# Patient Record
Sex: Female | Born: 1990 | Race: White | Hispanic: No | Marital: Single | Smoking: Never smoker
Health system: Southern US, Community
[De-identification: ages and names within clinical notes are randomized; demographics above are authoritative.]

## PROBLEM LIST (undated history)

## (undated) ENCOUNTER — Inpatient Hospital Stay: Payer: Self-pay

## (undated) DIAGNOSIS — Z789 Other specified health status: Secondary | ICD-10-CM

## (undated) HISTORY — PX: TONSILLECTOMY: SUR1361

## (undated) HISTORY — PX: WISDOM TOOTH EXTRACTION: SHX21

## (undated) HISTORY — PX: ANTERIOR CRUCIATE LIGAMENT REPAIR: SHX115

---
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
## 2012-08-23 ENCOUNTER — Emergency Department: Payer: Self-pay | Admitting: Emergency Medicine

## 2012-08-23 LAB — LIPASE, BLOOD: Lipase: 109 U/L (ref 73–393)

## 2012-08-23 LAB — COMPREHENSIVE METABOLIC PANEL
BUN: 14 mg/dL (ref 7–18)
Bilirubin,Total: 0.3 mg/dL (ref 0.2–1.0)
Calcium, Total: 8.7 mg/dL — ABNORMAL LOW (ref 9.0–10.7)
Chloride: 111 mmol/L — ABNORMAL HIGH (ref 98–107)
Co2: 23 mmol/L (ref 21–32)
Creatinine: 0.79 mg/dL (ref 0.60–1.30)
Glucose: 85 mg/dL (ref 65–99)
Potassium: 3.9 mmol/L (ref 3.5–5.1)
SGPT (ALT): 28 U/L (ref 12–78)

## 2012-08-23 LAB — CBC
HGB: 14.5 g/dL (ref 12.0–16.0)
MCH: 29.8 pg (ref 26.0–34.0)
Platelet: 301 10*3/uL (ref 150–440)
RBC: 4.86 10*6/uL (ref 3.80–5.20)
RDW: 13.2 % (ref 11.5–14.5)
WBC: 9.4 10*3/uL (ref 3.6–11.0)

## 2012-08-23 LAB — URINALYSIS, COMPLETE
Bacteria: NONE SEEN
Glucose,UR: NEGATIVE mg/dL (ref 0–75)
Leukocyte Esterase: NEGATIVE
Nitrite: NEGATIVE
RBC,UR: 2 /HPF (ref 0–5)
Squamous Epithelial: 5
WBC UR: 2 /HPF (ref 0–5)

## 2013-03-06 IMAGING — US ABDOMEN ULTRASOUND
1 series · 14 of 25 positions shown · non-contrast
Comparison: none

REASON FOR EXAM: EPIGASTRIC, BILATERAL SIDE PAIN
COMMENTS:   May transport without cardiac monitor

PROCEDURE:     US  - US ABDOMEN GENERAL SURVEY  - August 24, 2012  [DATE]
RESULT:

[Series 1: abdomen ultrasound · 0.23mm/px · 14 of 55 slices shown]
[im 1/55]
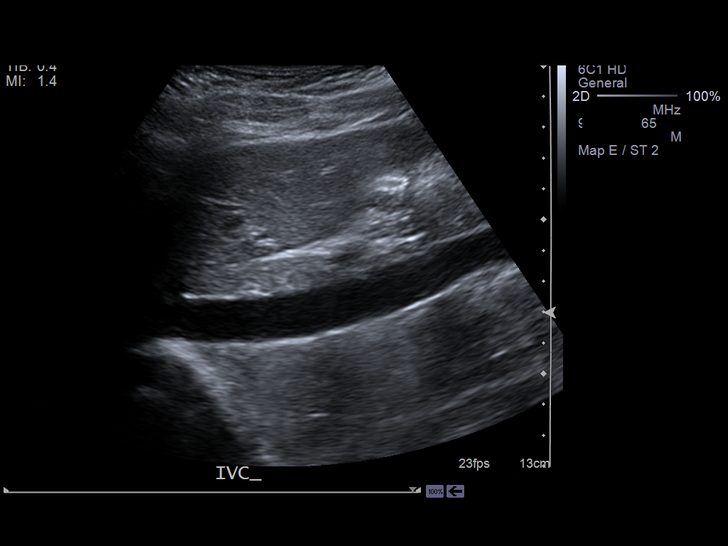
[im 5/55]
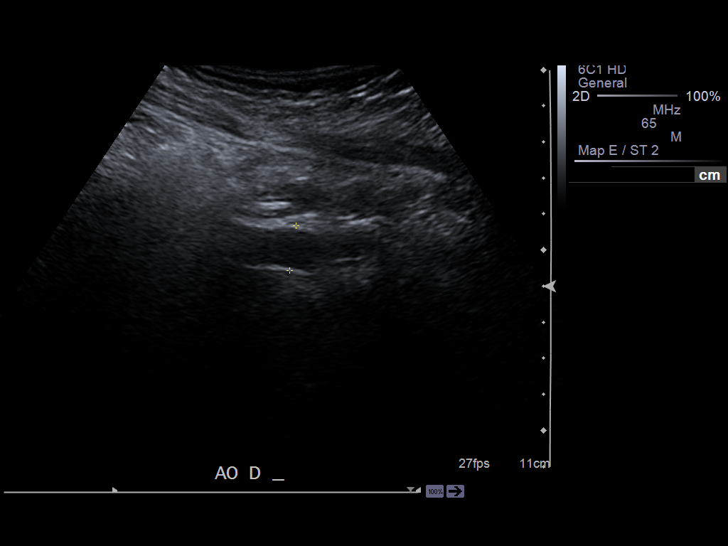
[im 10/55]
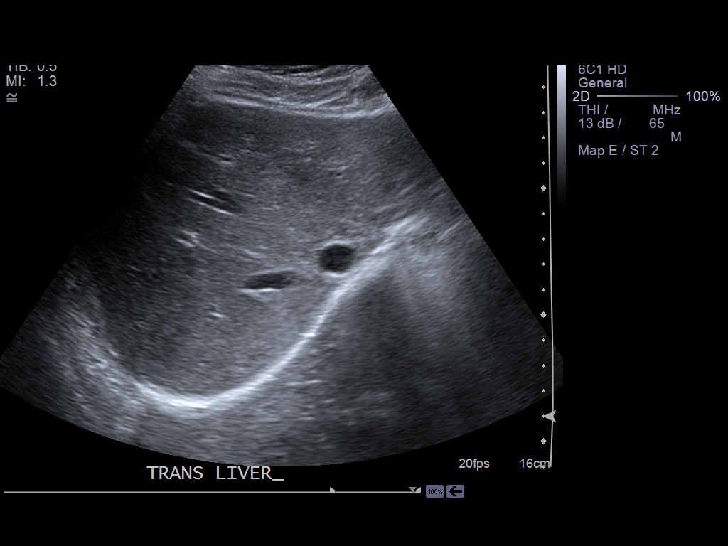
[im 14/55]
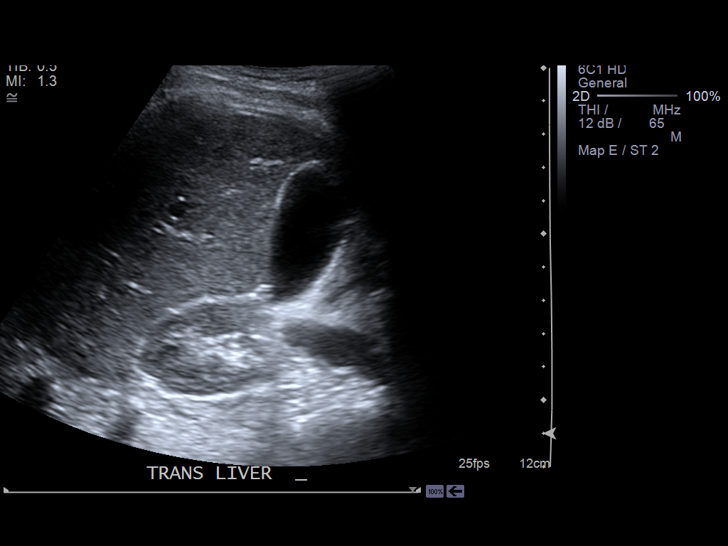
[im 19/55]
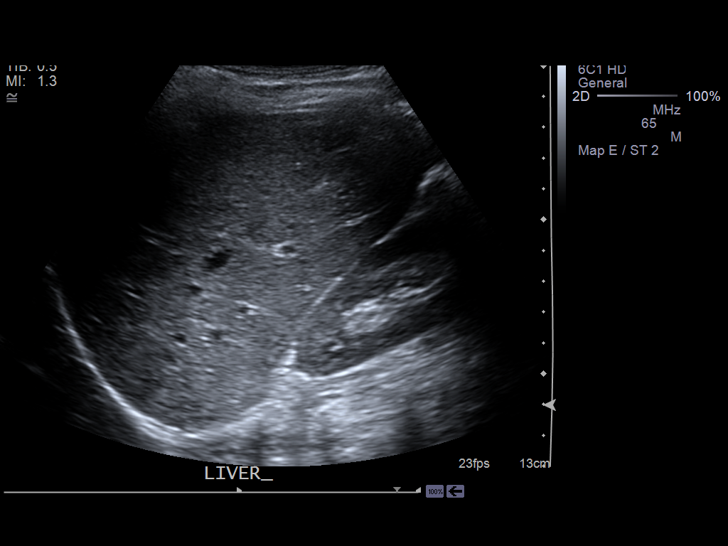
[im 21/55]
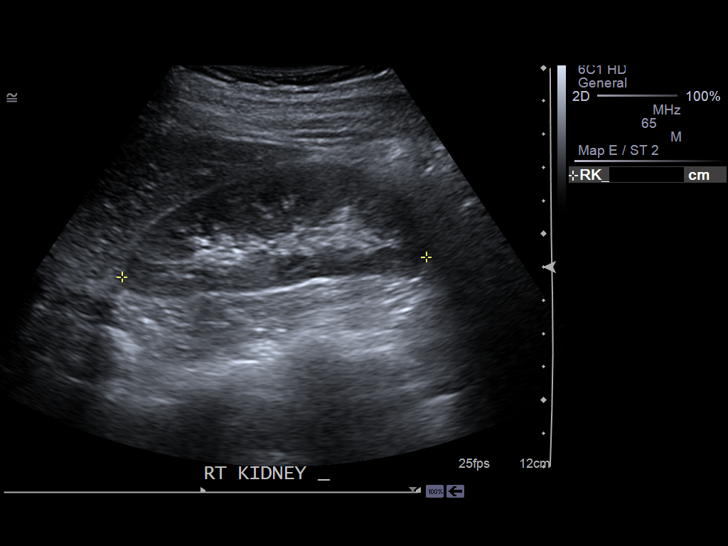
[im 25/55]
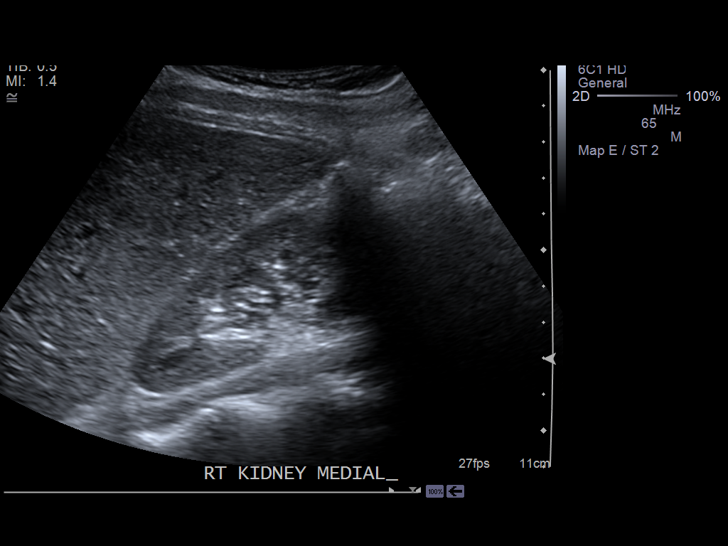
[im 30/55]
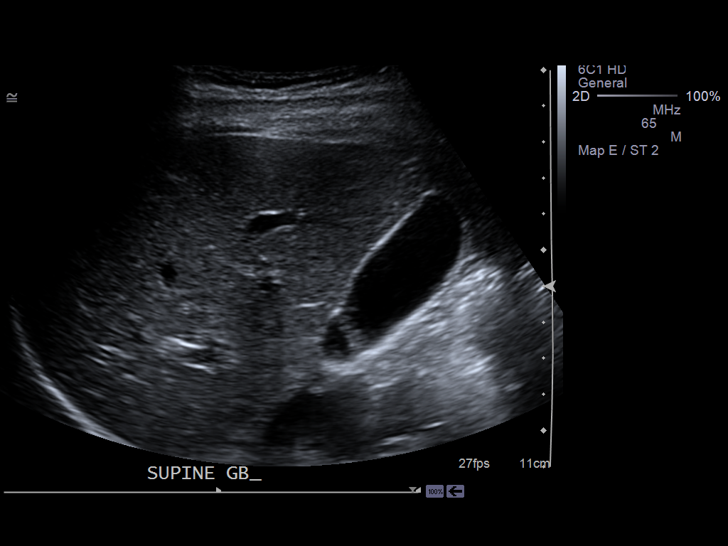
[im 34/55]
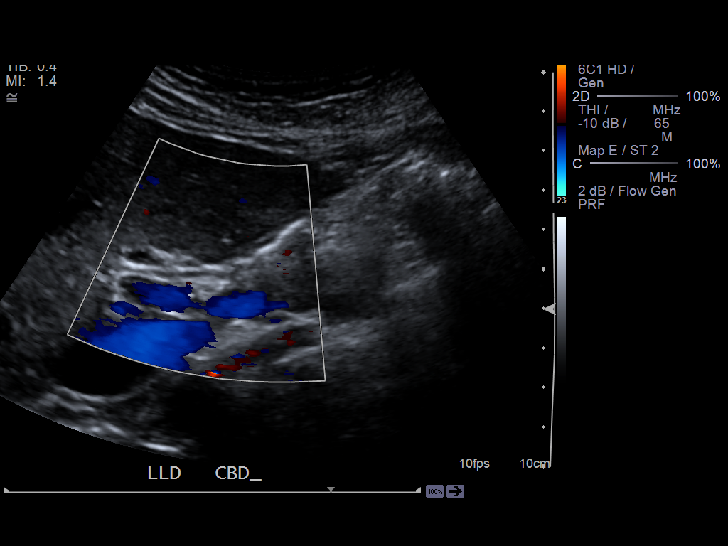
[im 37/55]
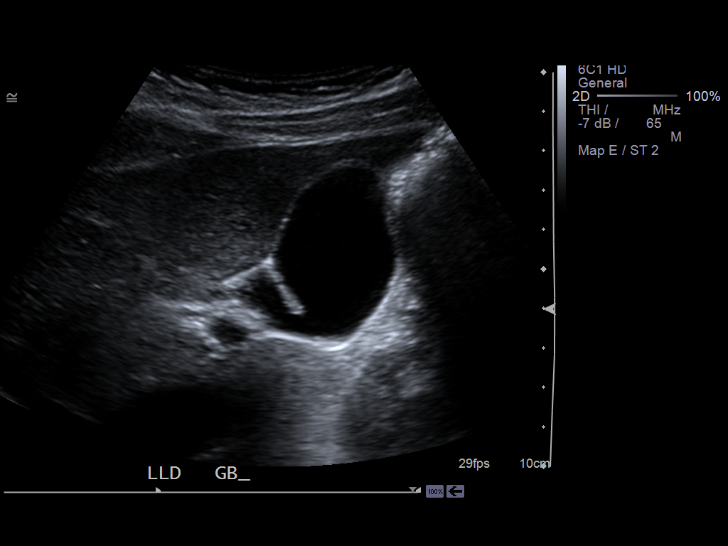
[im 41/55]
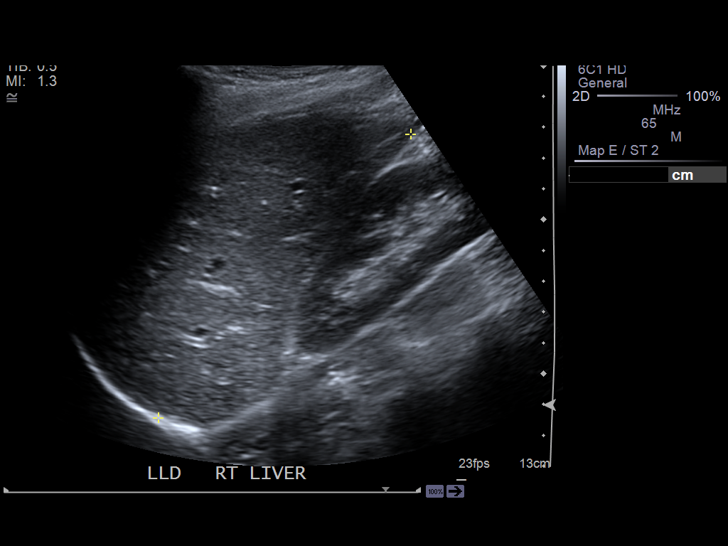
[im 46/55]
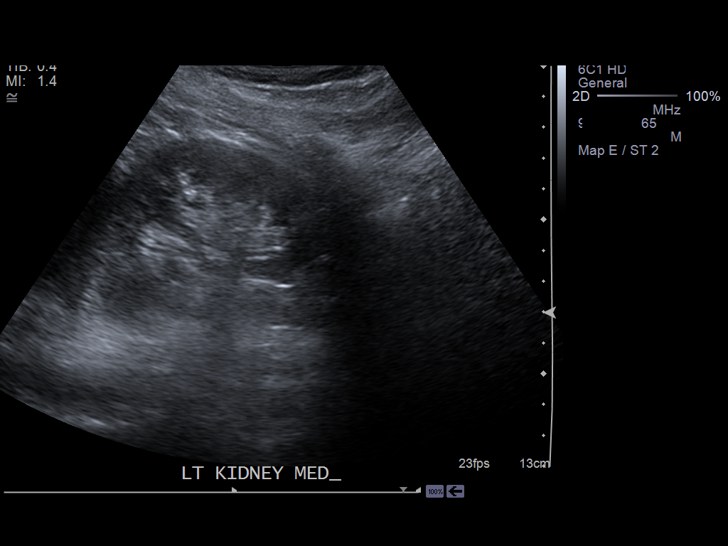
[im 50/55]
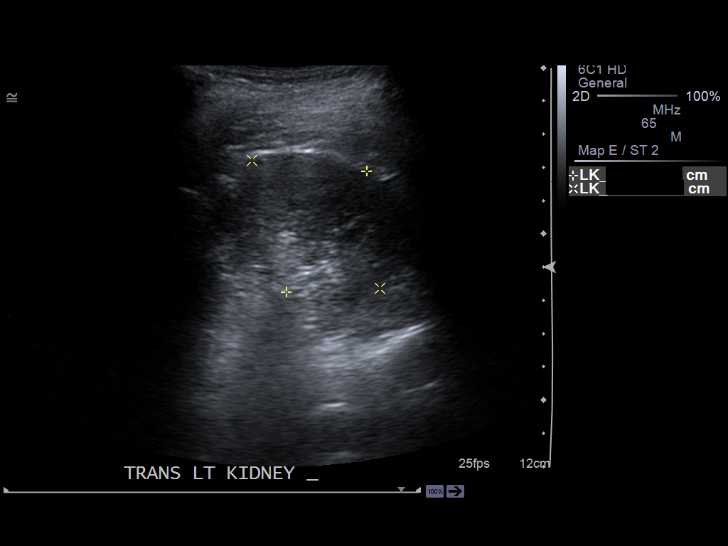
[im 55/55]
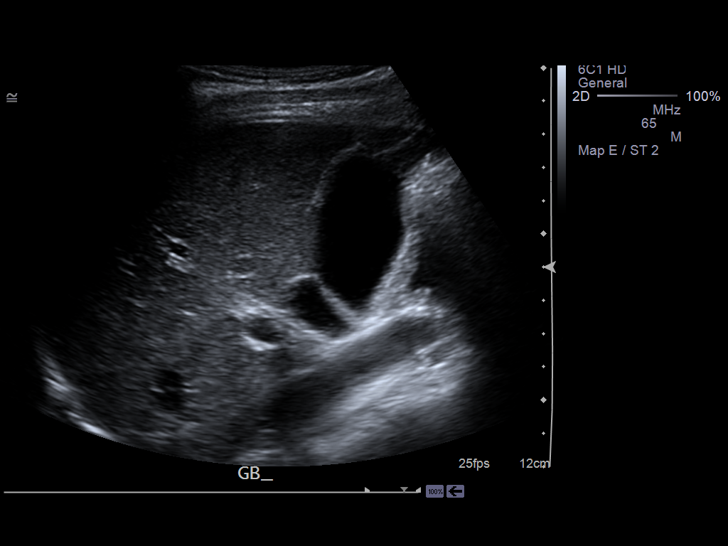

[14 of 25 positions shown; findings below may reference images not displayed]

FINDINGS: Abdominal Ultrasound demonstrates a normal echotexture of the
liver. There is no intrahepatic or extrahepatic biliary ductal dilation. The
common bile duct measures 3.2 mm. The liver length is 12.3 cm. The pancreas
appears normal. The proximal, mid and distal abdominal aorta appears
unremarkable. The inferior vena cava is patent proximally. The spleen
measures 7.7 cm and is normal in echotexture. The portal venous flow is
normal. The right kidney measures 9.17 x 3.71 x 3.44 cm. The left kidney is
9.54 x 4.37 x 5.44 cm. There are no solid or cystic masses. No urinary tract
obstructive changes are seen. There is no nephrolithiasis. No gallstones are
evident. Gallbladder wall thickness is 1.5 mm.
IMPRESSION: Normal appearing Abdominal Ultrasound.

[REDACTED]

## 2014-05-24 ENCOUNTER — Ambulatory Visit: Payer: Self-pay | Admitting: Neurology

## 2014-06-21 DIAGNOSIS — G43719 Chronic migraine without aura, intractable, without status migrainosus: Secondary | ICD-10-CM | POA: Insufficient documentation

## 2015-05-19 DIAGNOSIS — F988 Other specified behavioral and emotional disorders with onset usually occurring in childhood and adolescence: Secondary | ICD-10-CM | POA: Insufficient documentation

## 2020-07-30 NOTE — L&D Delivery Note (Signed)
Delivery Note  Toni Foster is a G1P1001 at [redacted]w[redacted]d with an unsure LMP, inconsistent with [place] at [redacted]w[redacted]d.   First Stage: Labor onset: 2024 with SROM Induction: misoprostol, oxytocin  Analgesia /Anesthesia intrapartum: epidural SROM at 2024 on 10/23/2020  Second Stage: Complete dilation at 1307 Onset of pushing at 1307 FHR second stage 125bpm with moderate variability, recurrent lates and variables noted during pushing.  Good return to baseline in between contractions.  Toni Foster presented to L&D for a scheduled IOL for postdates pregnancy.  She was actively managed with misoprostol and oxytocin.  She progressed well after SROM and was comfortable with an epidural.  She had a protracted active phase with cervical edema.  Oxytocin was turned off d/t NRFHR that resolved with conservative measures.  IV benadryl was given and frequent positioning utilized to help with rotation from OP to ROA.  She had a strong urge to push but still had a thin anterior lip present.  She was able to push to C/C/+1 with approximately 3 contractions.  She continued to push effectively for approximately 2 hours.   Delivery of a viable baby boy on 10/24/2020 at 1450 by CNM Delivery of fetal head in OA position with restitution to ROP. No nuchal cord;  Shoulders found to be in transverse with fetal head in ROP position.  Shoulders were manually rotated to oblique positions for delivery.  Anterior then posterior shoulders delivered easily after rotation with gentle downward traction. Baby placed on mom's chest, and attended to by peds. Cord double clamped after cessation of pulsation, cut by father of baby.  Cord blood sample collected: N/A, A pos   Third Stage: Oxytocin bolus started after delivery of infant for hemorrhage prophylaxis  Placenta delivered intact with 3 VC @ 1457 Placenta disposition: discarded  Uterine tone firm / bleeding small  1st degree hymenal laceration identified - hemostatic, approximates  well Anesthesia for repair: N/A Repair: N/A Est. Blood Loss (mL):  Complications: Protracted active phase   Mom to postpartum.  Baby to Couplet care / Skin to Skin.  Newborn: Information for the patient's newborn:  Toni, Foster [[id number]]  Live born female "Ace" Birth Weight: 7 lb 15 oz (3600 g) APGAR: 8, 9  Newborn Delivery   Birth date/time: 10/24/2020 14:50:00 Delivery type: Vaginal, Spontaneous    Feeding planned: Breast   ---------- Margaretmary Eddy, CNM Certified Nurse Midwife [facility]

## 2020-09-23 LAB — OB RESULTS CONSOLE GBS: GBS: NEGATIVE

## 2020-09-23 LAB — OB RESULTS CONSOLE GC/CHLAMYDIA
Chlamydia: NEGATIVE
Gonorrhea: NEGATIVE

## 2020-09-23 LAB — OB RESULTS CONSOLE HEPATITIS B SURFACE ANTIGEN: Hepatitis B Surface Ag: NEGATIVE

## 2020-09-23 LAB — OB RESULTS CONSOLE RPR: RPR: NONREACTIVE

## 2020-09-23 LAB — OB RESULTS CONSOLE RUBELLA ANTIBODY, IGM: Rubella: IMMUNE

## 2020-09-23 LAB — OB RESULTS CONSOLE HIV ANTIBODY (ROUTINE TESTING): HIV: NONREACTIVE

## 2020-09-23 LAB — OB RESULTS CONSOLE VARICELLA ZOSTER ANTIBODY, IGG: Varicella: IMMUNE

## 2020-10-14 ENCOUNTER — Other Ambulatory Visit: Payer: Self-pay | Admitting: Obstetrics and Gynecology

## 2020-10-14 NOTE — Progress Notes (Signed)
Dating: EDD: 10/18/20  by LMP: unknown, Nexplanon removal date 01/12/20, and c/w [place] at 9+0 wks.   Preg c/b: 1. Chronic migraines on prophy, Propanolol per [facility].  2. 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  3. Initial screening TSH low, repeat WNL  Prenatal Labs: Blood type/Rh A pos  Antibody screen neg  Rubella Immune  Varicella Immune  RPR NR  HBsAg Neg  HIV NR  GC neg  Chlamydia neg  Genetic screening negative  1 hour GTT 127  3 hour GTT   GBS Negative    Contraception: Nexplanon Infant feeding: plans to pump and feed EBM Tdap:08/18/20 Flu: 08/01/20

## 2020-10-19 ENCOUNTER — Encounter: Payer: Self-pay | Admitting: Obstetrics and Gynecology

## 2020-10-19 ENCOUNTER — Observation Stay
Admission: EM | Admit: 2020-10-19 | Discharge: 2020-10-20 | Disposition: A | Discharge status: Home / Self Care | Payer: BC Managed Care – PPO | Source: Home / Self Care | Admitting: Obstetrics and Gynecology

## 2020-10-19 ENCOUNTER — Other Ambulatory Visit: Payer: Self-pay

## 2020-10-19 DIAGNOSIS — O471 False labor at or after 37 completed weeks of gestation: Secondary | ICD-10-CM | POA: Insufficient documentation

## 2020-10-19 DIAGNOSIS — Z3A40 40 weeks gestation of pregnancy: Secondary | ICD-10-CM | POA: Insufficient documentation

## 2020-10-19 DIAGNOSIS — O479 False labor, unspecified: Secondary | ICD-10-CM | POA: Diagnosis present

## 2020-10-19 HISTORY — DX: Other specified health status: Z78.9

## 2020-10-19 MED ORDER — ACETAMINOPHEN 500 MG PO TABS
1000.0000 mg | ORAL_TABLET | ORAL | Status: DC | PRN
  Administered 2020-10-19: 1000 mg via ORAL

## 2020-10-19 MED ORDER — ACETAMINOPHEN 500 MG PO TABS
ORAL_TABLET | ORAL | Status: AC
  Filled 2020-10-19: qty 2

## 2020-10-19 NOTE — OB Triage Note (Signed)
Pt is a 29y/o G1P0 at wd with c/o contractions every 5 minutes that began about 530pm rating them not 8/10. Pt states +FM. Pt denies LOF, and VB. Monitors applied and assessing. Initial FHT 125.

## 2020-10-20 LAB — WET PREP, GENITAL
Clue Cells Wet Prep HPF POC: NONE SEEN
Sperm: NONE SEEN
Trich, Wet Prep: NONE SEEN
Yeast Wet Prep HPF POC: NONE SEEN

## 2020-10-20 MED ORDER — MORPHINE SULFATE (PF) 10 MG/ML IV SOLN
10.0000 mg | Freq: Once | INTRAVENOUS | Status: AC
  Administered 2020-10-20: 10 mg via INTRAMUSCULAR
  Filled 2020-10-20: qty 1

## 2020-10-20 MED ORDER — PROMETHAZINE HCL 25 MG/ML IJ SOLN
25.0000 mg | Freq: Once | INTRAMUSCULAR | Status: AC
  Administered 2020-10-20: 25 mg via INTRAMUSCULAR
  Filled 2020-10-20: qty 1

## 2020-10-20 NOTE — Discharge Summary (Signed)
Patient ID: Toni Foster MRN: [id number] DOB/AGE: 1991/07/02 30 y.o.  Admit date: 10/19/2020 Discharge date: 10/20/2020  Admission Diagnoses: UCs at 40 week  Discharge Diagnoses: Not in labor  Prenatal Procedures: NST  Consults: None  Significant Diagnostic Studies:  Results for orders placed or performed during the hospital encounter of 10/19/20 (from the past 168 hour(s))  Wet prep, genital   Collection Time: 10/20/20 12:28 AM   Specimen: Vaginal  Result Value Ref Range   Yeast Wet Prep HPF POC NONE SEEN NONE SEEN   Trich, Wet Prep NONE SEEN NONE SEEN   Clue Cells Wet Prep HPF POC NONE SEEN NONE SEEN   WBC, Wet Prep HPF POC RARE (A) NONE SEEN   Sperm NONE SEEN     Treatments: Morphine Rest  Hospital Course:  This is a 30 y.o. G1P0 with IUP at [redacted]w[redacted]d seen for UCs, noted to have a cervical exam of 1/50/-3 posterior.  No leaking of fluid and no bleeding. She was given morphine and phenergan for Morphine rest.   She was observed, fetal heart rate monitoring remained reassuring, and she had no signs/symptoms of progressing labor or other maternal-fetal concerns.  Her cervical exam was unchanged from admission.  She was deemed stable for discharge to home with outpatient follow up.  Discharge Physical Exam:  BP 106/65 (BP Location: Left Arm)   Pulse 81   Temp 98.3 F (36.8 C) (Oral)   Resp 17   Ht 5\' 2"  (1.575 m)   Wt 83 kg   BMI 33.47 kg/m   General: NAD CV: RRR Pulm: CTABL, nl effort ABD: s/nd/nt, gravid DVT Evaluation: LE non-ttp, no evidence of DVT on exam.  NST: FHR baseline: 125 bpm Variability: moderate Accelerations: yes Decelerations: none Category/reactivity: reactive   TOCO: occasional  SVE:  Dilation: 1 Effacement (%): 50 Cervical Position: Posterior Station: -3 Exam by:: [email or web address] CNM   Discharge Condition: Stable  Disposition: Discharge disposition: 01-Home or Self Care        Allergies as of 10/20/2020   No Known Allergies      Medication List    STOP taking these medications   amoxicillin-clavulanate 875-125 MG tablet Commonly known as: AUGMENTIN   cetirizine 10 MG tablet Commonly known as: ZYRTEC   etonogestrel-ethinyl estradiol 0.12-0.015 MG/24HR vaginal ring Commonly known as: NUVARING     TAKE these medications   diphenhydrAMINE 12.5 MG chewable tablet Commonly known as: BENADRYL Chew 12.5 mg by mouth 4 (four) times daily as needed for allergies.   loratadine 10 MG tablet Commonly known as: CLARITIN Take 10 mg by mouth daily.   multivitamin-prenatal 27-0.8 MG Tabs tablet Take 1 tablet by mouth daily at 12 noon.   propranolol 60 MG tablet Commonly known as: INDERAL Take 60 mg by mouth 3 (three) times daily.        [id number], CNM 10/20/2020 8:08 AM

## 2020-10-20 NOTE — Progress Notes (Signed)
Pt given discharge instructions. Pt set up for IOL on Sunday. Pt will go get covid test tomorrow. Reviewed labor precautions. Pt discharged with significant other.

## 2020-10-21 ENCOUNTER — Other Ambulatory Visit: Payer: Self-pay

## 2020-10-21 ENCOUNTER — Other Ambulatory Visit
Admission: RE | Admit: 2020-10-21 | Discharge: 2020-10-21 | Disposition: A | Discharge status: Home / Self Care | Payer: BC Managed Care – PPO | Source: Ambulatory Visit | Attending: Obstetrics and Gynecology | Admitting: Obstetrics and Gynecology

## 2020-10-21 DIAGNOSIS — Z20822 Contact with and (suspected) exposure to covid-19: Secondary | ICD-10-CM | POA: Insufficient documentation

## 2020-10-21 DIAGNOSIS — Z01812 Encounter for preprocedural laboratory examination: Secondary | ICD-10-CM | POA: Insufficient documentation

## 2020-10-22 LAB — SARS CORONAVIRUS 2 (TAT 6-24 HRS): SARS Coronavirus 2: NEGATIVE

## 2020-10-23 ENCOUNTER — Encounter: Payer: Self-pay | Admitting: Obstetrics and Gynecology

## 2020-10-23 ENCOUNTER — Inpatient Hospital Stay: Payer: BC Managed Care – PPO | Admitting: Anesthesiology

## 2020-10-23 ENCOUNTER — Inpatient Hospital Stay
Admission: EM | Admit: 2020-10-23 | Discharge: 2020-10-25 | DRG: 806 | Disposition: A | Discharge status: Home / Self Care | Payer: BC Managed Care – PPO | Attending: Obstetrics and Gynecology | Admitting: Obstetrics and Gynecology

## 2020-10-23 ENCOUNTER — Other Ambulatory Visit: Payer: Self-pay

## 2020-10-23 DIAGNOSIS — O99354 Diseases of the nervous system complicating childbirth: Secondary | ICD-10-CM | POA: Diagnosis present

## 2020-10-23 DIAGNOSIS — Z20822 Contact with and (suspected) exposure to covid-19: Secondary | ICD-10-CM | POA: Diagnosis present

## 2020-10-23 DIAGNOSIS — O9081 Anemia of the puerperium: Secondary | ICD-10-CM | POA: Diagnosis not present

## 2020-10-23 DIAGNOSIS — Z3A40 40 weeks gestation of pregnancy: Secondary | ICD-10-CM

## 2020-10-23 DIAGNOSIS — D62 Acute posthemorrhagic anemia: Secondary | ICD-10-CM | POA: Diagnosis not present

## 2020-10-23 DIAGNOSIS — O480 Post-term pregnancy: Principal | ICD-10-CM | POA: Diagnosis present

## 2020-10-23 DIAGNOSIS — G43709 Chronic migraine without aura, not intractable, without status migrainosus: Secondary | ICD-10-CM | POA: Diagnosis present

## 2020-10-23 DIAGNOSIS — O26893 Other specified pregnancy related conditions, third trimester: Secondary | ICD-10-CM | POA: Diagnosis present

## 2020-10-23 LAB — COMPREHENSIVE METABOLIC PANEL
ALT: 12 U/L (ref 0–44)
AST: 23 U/L (ref 15–41)
Albumin: 2.9 g/dL — ABNORMAL LOW (ref 3.5–5.0)
Alkaline Phosphatase: 86 U/L (ref 38–126)
Anion gap: 11 (ref 5–15)
BUN: 6 mg/dL (ref 6–20)
CO2: 18 mmol/L — ABNORMAL LOW (ref 22–32)
Calcium: 9.2 mg/dL (ref 8.9–10.3)
Chloride: 107 mmol/L (ref 98–111)
Creatinine, Ser: 0.55 mg/dL (ref 0.44–1.00)
GFR, Estimated: >60 mL/min (ref >60)
Glucose, Bld: 106 mg/dL — ABNORMAL HIGH (ref 70–99)
Potassium: 3.4 mmol/L — ABNORMAL LOW (ref 3.5–5.1)
Sodium: 136 mmol/L (ref 135–145)
Total Bilirubin: 0.5 mg/dL (ref 0.3–1.2)
Total Protein: 6 g/dL — ABNORMAL LOW (ref 6.5–8.1)

## 2020-10-23 LAB — CBC
HCT: 34.6 % — ABNORMAL LOW (ref 36.0–46.0)
Hemoglobin: 11.6 g/dL — ABNORMAL LOW (ref 12.0–15.0)
MCH: 32 pg (ref 26.0–34.0)
MCHC: 33.5 g/dL (ref 30.0–36.0)
MCV: 95.6 fL (ref 80.0–100.0)
Platelets: 226 10*3/uL (ref 150–400)
RBC: 3.62 MIL/uL — ABNORMAL LOW (ref 3.87–5.11)
RDW: 14 % (ref 11.5–15.5)
WBC: 11.6 10*3/uL — ABNORMAL HIGH (ref 4.0–10.5)
nRBC: 0 % (ref 0.0–0.2)

## 2020-10-23 LAB — PROTEIN / CREATININE RATIO, URINE
Creatinine, Urine: 53 mg/dL
Total Protein, Urine: <6 mg/dL

## 2020-10-23 LAB — TYPE AND SCREEN
ABO/RH(D): A POS
Antibody Screen: NEGATIVE

## 2020-10-23 LAB — ABO/RH: ABO/RH(D): A POS

## 2020-10-23 LAB — RPR: RPR Ser Ql: NONREACTIVE

## 2020-10-23 MED ORDER — LACTATED RINGERS IV SOLN: INTRAVENOUS | Status: DC

## 2020-10-23 MED ORDER — SODIUM CHLORIDE 0.9 % IV SOLN
INTRAVENOUS | Status: DC | PRN
  Administered 2020-10-23 (×2): 5 mL via EPIDURAL

## 2020-10-23 MED ORDER — OXYTOCIN BOLUS FROM INFUSION
333.0000 mL | Freq: Once | INTRAVENOUS | Status: AC
  Administered 2020-10-24: 333 mL via INTRAVENOUS

## 2020-10-23 MED ORDER — LIDOCAINE HCL (PF) 1 % IJ SOLN
INTRAMUSCULAR | Status: DC | PRN
  Administered 2020-10-23: 1 mL via SUBCUTANEOUS

## 2020-10-23 MED ORDER — AMMONIA AROMATIC IN INHA
RESPIRATORY_TRACT | Status: AC
  Filled 2020-10-23: qty 10

## 2020-10-23 MED ORDER — LACTATED RINGERS IV SOLN
500.0000 mL | Freq: Once | INTRAVENOUS | Status: AC
  Administered 2020-10-23: 500 mL via INTRAVENOUS

## 2020-10-23 MED ORDER — OXYTOCIN 10 UNIT/ML IJ SOLN
INTRAMUSCULAR | Status: AC
  Filled 2020-10-23: qty 2

## 2020-10-23 MED ORDER — TERBUTALINE SULFATE 1 MG/ML IJ SOLN
0.2500 mg | Freq: Once | INTRAMUSCULAR | Status: DC | PRN
Start: 2020-10-23 — End: 2020-10-24

## 2020-10-23 MED ORDER — LIDOCAINE-EPINEPHRINE (PF) 1.5 %-1:200000 IJ SOLN
INTRAMUSCULAR | Status: DC | PRN
  Administered 2020-10-23: 3 mL via EPIDURAL

## 2020-10-23 MED ORDER — ONDANSETRON HCL 4 MG/2ML IJ SOLN
4.0000 mg | Freq: Four times a day (QID) | INTRAMUSCULAR | Status: DC | PRN
  Administered 2020-10-23: 4 mg via INTRAVENOUS
  Filled 2020-10-23: qty 2

## 2020-10-23 MED ORDER — FENTANYL 2.5 MCG/ML W/ROPIVACAINE 0.15% IN NS 100 ML EPIDURAL (ARMC)
EPIDURAL | Status: AC
  Administered 2020-10-24: 12 mL/h via EPIDURAL
  Filled 2020-10-23: qty 100

## 2020-10-23 MED ORDER — MISOPROSTOL 25 MCG QUARTER TABLET
25.0000 ug | ORAL_TABLET | ORAL | Status: DC | PRN
Start: 2020-10-23 — End: 2020-10-24
  Administered 2020-10-23 (×2): 25 ug via BUCCAL
  Filled 2020-10-23 (×2): qty 1

## 2020-10-23 MED ORDER — EPHEDRINE 5 MG/ML INJ: 10.0000 mg | INTRAVENOUS | Status: DC | PRN

## 2020-10-23 MED ORDER — PHENYLEPHRINE 40 MCG/ML (10ML) SYRINGE FOR IV PUSH (FOR BLOOD PRESSURE SUPPORT): 80.0000 ug | PREFILLED_SYRINGE | INTRAVENOUS | Status: DC | PRN

## 2020-10-23 MED ORDER — LIDOCAINE HCL (PF) 1 % IJ SOLN
INTRAMUSCULAR | Status: AC
  Filled 2020-10-23: qty 30

## 2020-10-23 MED ORDER — MISOPROSTOL 25 MCG QUARTER TABLET
25.0000 ug | ORAL_TABLET | ORAL | Status: DC | PRN
Start: 2020-10-23 — End: 2020-10-24
  Administered 2020-10-23 (×2): 25 ug via VAGINAL
  Filled 2020-10-23 (×2): qty 1

## 2020-10-23 MED ORDER — FENTANYL CITRATE (PF) 100 MCG/2ML IJ SOLN
50.0000 ug | INTRAMUSCULAR | Status: DC | PRN
  Administered 2020-10-23: 100 ug via INTRAVENOUS
  Filled 2020-10-23: qty 2

## 2020-10-23 MED ORDER — SOD CITRATE-CITRIC ACID 500-334 MG/5ML PO SOLN
30.0000 mL | ORAL | Status: DC | PRN
Start: 2020-10-23 — End: 2020-10-24

## 2020-10-23 MED ORDER — OXYTOCIN-SODIUM CHLORIDE 30-0.9 UT/500ML-% IV SOLN
2.5000 [IU]/h | INTRAVENOUS | Status: DC
Start: 2020-10-23 — End: 2020-10-25
  Administered 2020-10-24: 2.5 [IU]/h via INTRAVENOUS
  Filled 2020-10-23: qty 500

## 2020-10-23 MED ORDER — ACETAMINOPHEN 325 MG PO TABS: 650.0000 mg | ORAL_TABLET | ORAL | Status: DC | PRN

## 2020-10-23 MED ORDER — FENTANYL 2.5 MCG/ML W/ROPIVACAINE 0.15% IN NS 100 ML EPIDURAL (ARMC)
12.0000 mL/h | EPIDURAL | Status: DC
  Administered 2020-10-23 – 2020-10-24 (×2): 12 mL/h via EPIDURAL
  Filled 2020-10-23 (×2): qty 100

## 2020-10-23 MED ORDER — LIDOCAINE HCL (PF) 1 % IJ SOLN: 30.0000 mL | INTRAMUSCULAR | Status: DC | PRN

## 2020-10-23 MED ORDER — MISOPROSTOL 200 MCG PO TABS
ORAL_TABLET | ORAL | Status: AC
  Filled 2020-10-23: qty 4

## 2020-10-23 MED ORDER — OXYTOCIN-SODIUM CHLORIDE 30-0.9 UT/500ML-% IV SOLN
1.0000 m[IU]/min | INTRAVENOUS | Status: DC
  Administered 2020-10-23: 2 m[IU]/min via INTRAVENOUS
  Filled 2020-10-23: qty 500

## 2020-10-23 MED ORDER — LACTATED RINGERS IV SOLN
500.0000 mL | INTRAVENOUS | Status: DC | PRN
  Administered 2020-10-24: 500 mL via INTRAVENOUS

## 2020-10-23 MED ORDER — DIPHENHYDRAMINE HCL 50 MG/ML IJ SOLN: 12.5000 mg | INTRAMUSCULAR | Status: DC | PRN

## 2020-10-23 NOTE — H&P (Signed)
OB History & Physical   History of Present Illness:  Chief Complaint: scheduled induction for late term.   HPI:  FAVOR KREH is a 30 y.o. G1P0 female at [redacted]w[redacted]d dated by [place] at 9+0wks.  She presents to L&D for scheduled IOL.  Reports active FM, having mild UCs that are "annoying" but not painful. Denies LOF or VB.   Pregnancy Issues: 1. Chronic migraines on prophy, Propanolol per [facility].  2. 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  3. Initial screening TSH low, repeat WNL   Maternal Medical History:   Past Medical History:  Diagnosis Date  . Medical history non-contributory     Past Surgical History:  Procedure Laterality Date  . ANTERIOR CRUCIATE LIGAMENT REPAIR    . TONSILLECTOMY    . WISDOM TOOTH EXTRACTION      No Known Allergies  Prior to Admission medications   Medication Sig Start Date End Date Taking? Authorizing Provider  diphenhydrAMINE (BENADRYL) 12.5 MG chewable tablet Chew 12.5 mg by mouth 4 (four) times daily as needed for allergies.   Yes [provider]  loratadine (CLARITIN) 10 MG tablet Take 10 mg by mouth daily.   Yes [provider]  Prenatal Vit-Fe Fumarate-FA (MULTIVITAMIN-PRENATAL) 27-0.8 MG TABS tablet Take 1 tablet by mouth daily at 12 noon.   Yes [provider]  propranolol (INDERAL) 60 MG tablet Take 60 mg by mouth 3 (three) times daily.   Yes [provider]     Prenatal care site: [facility]  Social History: She  reports that she has never smoked. She has never used smokeless tobacco. She reports that she does not drink alcohol and does not use drugs.  Family History: no family hx Gyn cancers  Review of Systems: A full review of systems was performed and negative except as noted in the HPI.     Physical Exam:  Vital Signs: BP 118/83 (BP Location: Left Arm)   Pulse 71   Temp 98 F (36.7 C) (Oral)   Resp 16   Ht 5\' 2"  (1.575 m)   Wt 83 kg   BMI 33.47 kg/m  General: no  acute distress.  HEENT: normocephalic, atraumatic Heart: regular rate & rhythm.  No murmurs/rubs/gallops Lungs: clear to auscultation bilaterally, normal respiratory effort Abdomen: soft, gravid, non-tender;  EFW: 7.5lbs Pelvic:   External: Normal external female genitalia  Cervix: Dilation: 2 / Effacement (%): 70,80 / Station: -2    Extremities: non-tender, symmetric, no edema bilaterally.  DTRs: 2+  Neurologic: Alert & oriented x 3.    Results for orders placed or performed during the hospital encounter of 10/23/20 (from the past 24 hour(s))  CBC     Status: Abnormal   Collection Time: 10/23/20  5:57 AM  Result Value Ref Range   WBC 11.6 (H) 4.0 - 10.5 K/uL   RBC 3.62 (L) 3.87 - 5.11 MIL/uL   Hemoglobin 11.6 (L) 12.0 - 15.0 g/dL   HCT 10/25/20 (L) 27.2 - 53.6 %   MCV 95.6 80.0 - 100.0 fL   MCH 32.0 26.0 - 34.0 pg   MCHC 33.5 30.0 - 36.0 g/dL   RDW 64.4 03.4 - 74.2 %   Platelets 226 150 - 400 K/uL   nRBC 0.0 0.0 - 0.2 %  Type and screen     Status: None   Collection Time: 10/23/20  5:57 AM  Result Value Ref Range   ABO/RH(D) A POS    Antibody Screen NEG  Sample Expiration      10/26/2020,2359 Performed at [facility], [address] [place], [place] [address]   RPR     Status: None   Collection Time: 10/23/20  5:57 AM  Result Value Ref Range   RPR Ser Ql NON REACTIVE NON REACTIVE  Comprehensive metabolic panel     Status: Abnormal   Collection Time: 10/23/20  5:57 AM  Result Value Ref Range   Sodium 136 135 - 145 mmol/L   Potassium 3.4 (L) 3.5 - 5.1 mmol/L   Chloride 107 98 - 111 mmol/L   CO2 18 (L) 22 - 32 mmol/L   Glucose, Bld 106 (H) 70 - 99 mg/dL   BUN 6 6 - 20 mg/dL   Creatinine, Ser 5.10 0.44 - 1.00 mg/dL   Calcium 9.2 8.9 - 25.8 mg/dL   Total Protein 6.0 (L) 6.5 - 8.1 g/dL   Albumin 2.9 (L) 3.5 - 5.0 g/dL   AST 23 15 - 41 U/L   ALT 12 0 - 44 U/L   Alkaline Phosphatase 86 38 - 126 U/L   Total Bilirubin 0.5 0.3 - 1.2 mg/dL   GFR, Estimated >52  >77 mL/min   Anion gap 11 5 - 15  Protein / creatinine ratio, urine     Status: None   Collection Time: 10/23/20  6:08 AM  Result Value Ref Range   Creatinine, Urine 53 mg/dL   Total Protein, Urine <6 mg/dL   Protein Creatinine Ratio        0.00 - 0.15 mg/mg[Cre]  ABO/Rh     Status: None   Collection Time: 10/23/20  7:14 AM  Result Value Ref Range   ABO/RH(D)      A POS Performed at [facility], [address] [place], [place] [address]     Pertinent Results:  Prenatal Labs: Blood type/Rh A Pos  Antibody screen neg  Rubella Immune  Varicella Immune  RPR NR  HBsAg Neg  HIV NR  GC neg  Chlamydia neg  Genetic screening negative  1 hour GTT  127  3 hour GTT   GBS  Negative   FHT:  115bpm, mod variability, + accels, no decels TOCO: q2-36min SVE:  Dilation: 2 / Effacement (%): 70,80 / Station: -2    Cephalic by leopolds/SVE  No results found.  Assessment:  MAKEISHA JENTSCH is a 30 y.o. G1P0 female at [redacted]w[redacted]d with late term IOL.   Plan:  1. Admit to Labor & Delivery; consents reviewed and obtained - COVID swab negative  2. Fetal Well being  - Fetal Tracing: Cat I tracing - Group B Streptococcus ppx indicated: negative - Presentation: cephalic confirmed by exam   3. Routine OB: - Prenatal labs reviewed, as above - Rh A pos - CBC, T&S, RPR on admit - Clear fluids, IVF  4. Induction of Labor -  Contractions: external toco in place -  Pelvis adequate for TOL -  Plan for induction with cytotec -  Plan for continuous fetal monitoring  -  Maternal pain control as desired - Anticipate vaginal delivery  5. Post Partum Planning: Contraception: Nexplanon Infant feeding: plans to pump and feed EBM Tdap:08/18/20 Flu: 08/01/20  Prudencio Pair Nami Strawder, CNM 10/23/20 12:17 PM

## 2020-10-23 NOTE — Anesthesia Procedure Notes (Signed)
Epidural Patient location during procedure: OB Start time: 10/23/2020 8:41 PM End time: 10/23/2020 8:46 PM  Staffing Anesthesiologist: Zianna Dercole, Cleda Mccreedy, MD Performed: anesthesiologist   Preanesthetic Checklist Completed: patient identified, IV checked, site marked, risks and benefits discussed, surgical consent, monitors and equipment checked, pre-op evaluation and timeout performed  Epidural Patient position: sitting Prep: ChloraPrep Patient monitoring: heart rate, continuous pulse ox and blood pressure Approach: midline Location: L3-L4 Injection technique: LOR saline  Needle:  Needle type: Tuohy  Needle gauge: 17 G Needle length: 9 cm and 9 Needle insertion depth: 7 cm Catheter type: closed end flexible Catheter size: 19 Gauge Catheter at skin depth: 11 cm Test dose: negative and 1.5% lidocaine with Epi 1:200 K  Assessment Sensory level: T10 Events: blood not aspirated, injection not painful, no injection resistance, no paresthesia and negative IV test  Additional Notes 1 attempt.  Patient exquisitely tender during the procedure. Pt. Evaluated and documentation done after procedure finished. Patient identified. Risks/Benefits/Options discussed with patient including but not limited to bleeding, infection, nerve damage, paralysis, failed block, incomplete pain control, headache, blood pressure changes, nausea, vomiting, reactions to medication both or allergic, itching and postpartum back pain. Confirmed with bedside nurse the patient's most recent platelet count. Confirmed with patient that they are not currently taking any anticoagulation, have any bleeding history or any family history of bleeding disorders. Patient expressed understanding and wished to proceed. All questions were answered. Sterile technique was used throughout the entire procedure. Please see nursing notes for vital signs. Test dose was given through epidural catheter and negative prior to continuing to  dose epidural or start infusion. Warning signs of high block given to the patient including shortness of breath, tingling/numbness in hands, complete motor block, or any concerning symptoms with instructions to call for help. Patient was given instructions on fall risk and not to get out of bed. All questions and concerns addressed with instructions to call with any issues or inadequate analgesia.   Patient tolerated the insertion well without immediate complications.Reason for block:procedure for pain

## 2020-10-23 NOTE — Progress Notes (Signed)
Labor Progress Note  Toni Foster is a 30 y.o. G1P0 at [redacted]w[redacted]d by ultrasound admitted for induction of labor due to Post dates. Due date 10/18/20  Subjective: feeling more uncomfortable cramping.   Objective: BP 110/60 (BP Location: Left Arm)   Pulse 75   Temp 98.2 F (36.8 C) (Oral)   Resp 16   Ht 5\' 2"  (1.575 m)   Wt 83 kg   BMI 33.47 kg/m  Notable VS details: reviewed  Fetal Assessment: FHT:  FHR: 120 bpm, variability: moderate,  accelerations:  Present,  decelerations:  Absent Category/reactivity:  Category I UC:   regular, every 2-3 minutes SVE:   3/75/-2, soft/anterior. Membranes swept.   Membrane status: intact   Labs: Lab Results  Component Value Date   WBC 11.6 (H) 10/23/2020   HGB 11.6 (L) 10/23/2020   HCT 34.6 (L) 10/23/2020   MCV 95.6 10/23/2020   PLT 226 10/23/2020    Assessment / Plan: G1P0 at [redacted]w[redacted]d, IOL  Labor: Progressing normally, s/p 2 doses cytotec, plan to start Pitocin.  Preeclampsia:  no e/o Pre-E Fetal Wellbeing:  Category I Pain Control:  Labor support without medications I/D:  n/a Anticipated MOD:  NSVD  [redacted]w[redacted]d, CNM 10/23/2020, 4:47 PM

## 2020-10-23 NOTE — Anesthesia Preprocedure Evaluation (Signed)
Anesthesia Evaluation  Patient identified by MRN, date of birth, ID band Patient awake    Reviewed: Allergy & Precautions, H&P , NPO status , Patient's Chart, lab work & pertinent test results  History of Anesthesia Complications Negative for: history of anesthetic complications  Airway Mallampati: III  TM Distance: >3 FB Neck ROM: full    Dental  (+) Chipped   Pulmonary neg pulmonary ROS,    Pulmonary exam normal        Cardiovascular Exercise Tolerance: Good (-) hypertensionnegative cardio ROS Normal cardiovascular exam     Neuro/Psych    GI/Hepatic negative GI ROS,   Endo/Other    Renal/GU   negative genitourinary   Musculoskeletal   Abdominal   Peds  Hematology negative hematology ROS (+)   Anesthesia Other Findings Past Medical History: No date: Medical history non-contributory  Past Surgical History: No date: ANTERIOR CRUCIATE LIGAMENT REPAIR No date: TONSILLECTOMY No date: WISDOM TOOTH EXTRACTION  BMI    Body Mass Index: 33.47 kg/m      Reproductive/Obstetrics (+) Pregnancy                             Anesthesia Physical Anesthesia Plan  ASA: II  Anesthesia Plan: Epidural   Post-op Pain Management:    Induction:   PONV Risk Score and Plan:   Airway Management Planned: Natural Airway  Additional Equipment:   Intra-op Plan:   Post-operative Plan:   Informed Consent: I have reviewed the patients History and Physical, chart, labs and discussed the procedure including the risks, benefits and alternatives for the proposed anesthesia with the patient or authorized representative who has indicated his/her understanding and acceptance.     Dental Advisory Given  Plan Discussed with: Anesthesiologist  Anesthesia Plan Comments: (Patient reports no bleeding problems and no anticoagulant use.   Patient consented for risks of anesthesia including but not  limited to:  - adverse reactions to medications - risk of bleeding, infection and or nerve damage from epidural that could lead to paralysis - risk of headache or failed epidural - nerve damage due to positioning - that if epidural is used for C-section that there is a chance of epidural failure requiring spinal placement or conversion to GA - Damage to heart, brain, lungs, other parts of body or loss of life  Patient voiced understanding.)        Anesthesia Quick Evaluation

## 2020-10-24 ENCOUNTER — Encounter: Payer: Self-pay | Admitting: Obstetrics and Gynecology

## 2020-10-24 MED ORDER — WITCH HAZEL-GLYCERIN EX PADS
1.0000 "application " | MEDICATED_PAD | CUTANEOUS | Status: DC
  Filled 2020-10-24 (×2): qty 100

## 2020-10-24 MED ORDER — WITCH HAZEL-GLYCERIN EX PADS
MEDICATED_PAD | CUTANEOUS | Status: AC
  Filled 2020-10-24: qty 100

## 2020-10-24 MED ORDER — IBUPROFEN 600 MG PO TABS
600.0000 mg | ORAL_TABLET | Freq: Four times a day (QID) | ORAL | Status: DC
  Administered 2020-10-24 – 2020-10-25 (×5): 600 mg via ORAL
  Filled 2020-10-24 (×5): qty 1

## 2020-10-24 MED ORDER — DIBUCAINE (PERIANAL) 1 % EX OINT
1.0000 "application " | TOPICAL_OINTMENT | CUTANEOUS | Status: DC | PRN
  Administered 2020-10-24: 1 via RECTAL
  Filled 2020-10-24 (×2): qty 28

## 2020-10-24 MED ORDER — BENZOCAINE-MENTHOL 20-0.5 % EX AERO
1.0000 "application " | INHALATION_SPRAY | CUTANEOUS | Status: DC | PRN
  Filled 2020-10-24 (×2): qty 56

## 2020-10-24 MED ORDER — CALCIUM CARBONATE ANTACID 500 MG PO CHEW
CHEWABLE_TABLET | ORAL | Status: AC
  Filled 2020-10-24: qty 2

## 2020-10-24 MED ORDER — ONDANSETRON HCL 4 MG PO TABS
4.0000 mg | ORAL_TABLET | ORAL | Status: DC | PRN
  Filled 2020-10-24: qty 1

## 2020-10-24 MED ORDER — ACETAMINOPHEN 500 MG PO TABS
1000.0000 mg | ORAL_TABLET | Freq: Four times a day (QID) | ORAL | Status: DC | PRN
  Administered 2020-10-24 – 2020-10-25 (×5): 1000 mg via ORAL
  Filled 2020-10-24 (×5): qty 2

## 2020-10-24 MED ORDER — DIBUCAINE (PERIANAL) 1 % EX OINT
TOPICAL_OINTMENT | CUTANEOUS | Status: AC
  Filled 2020-10-24: qty 28

## 2020-10-24 MED ORDER — CALCIUM CARBONATE ANTACID 500 MG PO CHEW
400.0000 mg | CHEWABLE_TABLET | Freq: Three times a day (TID) | ORAL | Status: DC | PRN
  Administered 2020-10-24: 400 mg via ORAL

## 2020-10-24 MED ORDER — ONDANSETRON HCL 4 MG/2ML IJ SOLN: 4.0000 mg | INTRAMUSCULAR | Status: DC | PRN

## 2020-10-24 MED ORDER — DIPHENHYDRAMINE HCL 50 MG/ML IJ SOLN: 50.0000 mg | Freq: Once | INTRAMUSCULAR | Status: AC

## 2020-10-24 MED ORDER — PRENATAL MULTIVITAMIN CH
1.0000 | ORAL_TABLET | Freq: Every day | ORAL | Status: DC
  Administered 2020-10-25: 1 via ORAL
  Filled 2020-10-24: qty 1

## 2020-10-24 MED ORDER — SIMETHICONE 80 MG PO CHEW
80.0000 mg | CHEWABLE_TABLET | ORAL | Status: DC | PRN
  Administered 2020-10-24 (×2): 80 mg via ORAL
  Filled 2020-10-24 (×2): qty 1

## 2020-10-24 MED ORDER — DIPHENHYDRAMINE HCL 50 MG/ML IJ SOLN
INTRAMUSCULAR | Status: AC
  Administered 2020-10-24: 50 mg via INTRAVENOUS
  Filled 2020-10-24: qty 1

## 2020-10-24 MED ORDER — DOCUSATE SODIUM 100 MG PO CAPS
100.0000 mg | ORAL_CAPSULE | Freq: Two times a day (BID) | ORAL | Status: DC
  Administered 2020-10-24 – 2020-10-25 (×2): 100 mg via ORAL
  Filled 2020-10-24 (×2): qty 1

## 2020-10-24 MED ORDER — COCONUT OIL OIL
1.0000 "application " | TOPICAL_OIL | Status: DC | PRN
  Administered 2020-10-24: 1 via TOPICAL
  Filled 2020-10-24: qty 120

## 2020-10-24 MED ORDER — BUPIVACAINE HCL (PF) 0.25 % IJ SOLN
INTRAMUSCULAR | Status: DC | PRN
  Administered 2020-10-24: 5 mL via EPIDURAL

## 2020-10-24 MED ORDER — DIPHENHYDRAMINE HCL 25 MG PO CAPS: 25.0000 mg | ORAL_CAPSULE | Freq: Four times a day (QID) | ORAL | Status: DC | PRN

## 2020-10-24 MED ORDER — BENZOCAINE-MENTHOL 20-0.5 % EX AERO
INHALATION_SPRAY | CUTANEOUS | Status: AC
  Filled 2020-10-24: qty 56

## 2020-10-24 NOTE — Progress Notes (Signed)
Toni Foster is a 30 y.o. G1P0 at [redacted]w[redacted]d - protracted dilation, with an edematous cervix. Benadryl given, now anterior lip with some edema. Unable to be reduced.  Subjective: Pt not comfortable with her epidural  Objective: BP (!) 96/59   Pulse 71   Temp 98.6 F (37 C) (Oral)   Resp 16   Ht 5\' 2"  (1.575 m)   Wt 83 kg   SpO2 99%   BMI 33.47 kg/m  No intake/output data recorded. Total I/O In: 3358.2 [P.O.:240; I.V.:2991.6; Other:126.6] Out: 475 [Urine:475]  FHT:  Cat I UC:   q3-4 min SVE:   Dilation: Lip/rim Effacement (%): 100 Station: Plus 1 Exam by:: [email or web address] MD  Labs: Lab Results  Component Value Date   WBC 11.6 (H) 10/23/2020   HGB 11.6 (L) 10/23/2020   HCT 34.6 (L) 10/23/2020   MCV 95.6 10/23/2020   PLT 226 10/23/2020    Assessment / Plan: Trial of pushing with good movement, but I was unable to reduce the lip. The fetal head with caput to +1, head at 0. Cat I strip without evidence of infection, so will wait another hour but if no progression, will consider c/s. Try for epidural improvement, then pushing if fully dilated  10/25/2020 10/24/2020, 10:45 AM

## 2020-10-24 NOTE — Progress Notes (Signed)
Labor Progress Note  Toni Foster is a 30 y.o. G1P0 at [redacted]w[redacted]d by ultrasound admitted for induction of labor due to postdates pregnancy at [redacted]w[redacted]d.  Subjective: feeling intermittent pressure with contractions   Objective: BP 124/73   Pulse 74   Temp 98.6 F (37 C) (Oral)   Resp 16   Ht 5\' 2"  (1.575 m)   Wt 83 kg   SpO2 99%   BMI 33.47 kg/m  Notable VS details: reviewed   Fetal Assessment: FHT:  FHR: 125 bpm, variability: moderate,  accelerations:  Present,  decelerations:  Present occasional variables Category/reactivity:  Category II UC:   regular, every 2-4 minutes, MVU 105 SVE:  8/80/0/edematous  Membrane status: SROM at 2024 Amniotic color: Clear   Labs: Lab Results  Component Value Date   WBC 11.6 (H) 10/23/2020   HGB 11.6 (L) 10/23/2020   HCT 34.6 (L) 10/23/2020   MCV 95.6 10/23/2020   PLT 226 10/23/2020    Assessment / Plan: Protracted active phase  -Previous 9.5cm -Cervix now feels edematous -Suspect OP position -50 mg IV benadryl given  -Will use positioning to help with decreasing pressure of fetal head on cervix while IV benadryl helps with cervical swelling  -MVU's not adequate, will restart oxytocin  -Discussed that if these interventions do not work we would recommend a cesarean birth  -Dr. 10/25/2020 updated on progress and plan of care   Labor: Protracted active phase with inadequate MVUs.  Will restart oxytocin and titrate for adequate labor and fetal toleratnce.  Preeclampsia:  no signs or symptoms of toxicity Fetal Wellbeing:  Category II for occasional variables  -Overall reassuring with moderate variability and accels  Pain Control:  Epidural I/D:  ROM x 12 hours, GBS neg, afebrile Anticipated MOD:  NSVD  Dalbert Garnet, CNM 10/24/2020, 8:16 AM

## 2020-10-24 NOTE — Progress Notes (Signed)
Labor Progress Note  Toni Foster is a 30 y.o. G1P0 at [redacted]w[redacted]d by ultrasound admitted for induction of labor due to postdates pregnancy.  Subjective: feeling more intense pressure   Objective: BP 109/66 (BP Location: Left Arm)   Pulse 71   Temp 98.8 F (37.1 C) (Oral)   Resp 18   Ht 5\' 2"  (1.575 m)   Wt 83 kg   SpO2 99%   BMI 33.47 kg/m  Notable VS details: reivewed   Fetal Assessment: FHT:  FHR: 115 bpm, variability: moderate,  accelerations:  Present,  decelerations:  Present occasional variables  Category/reactivity:  Category II UC:   regular, every 2-4 minutes SVE:   9.5/100/+1 -> 10/100/+1  -Anterior lip reduced with pushing  Membrane status: SROM 2045 Amniotic color: Clear   Labs: Lab Results  Component Value Date   WBC 11.6 (H) 10/23/2020   HGB 11.6 (L) 10/23/2020   HCT 34.6 (L) 10/23/2020   MCV 95.6 10/23/2020   PLT 226 10/23/2020    Assessment / Plan: Protracted active phase  -Anterior lip reduced with pushing  -Will start pushing now since she has a strong urge to push and protracted active phase  -Dr. 10/25/2020 updated on progress and plan of care   Labor: Protracted active phase, now active 2nd stage of labor  Preeclampsia:  no signs or symptoms of toxicity Fetal Wellbeing:  Category II for occasional variables -Overall reassuring with moderate variability and accels  Pain Control:  Epidural I/D:  n/a Anticipated MOD:  NSVD  Dalbert Garnet, CNM 10/24/2020, 1:17 PM

## 2020-10-24 NOTE — Discharge Summary (Signed)
Obstetrical Discharge Summary  Patient Name: Toni Foster DOB: [birth year] MRN: [id number]  Date of Admission: 10/23/2020 Date of Delivery: 10/24/2020 Delivered by: Margaretmary Eddy, CNM  Date of Discharge: 10/25/2020  Primary OB: [facility] LMP:No LMP recorded. EDC Estimated Date of Delivery: 10/18/20 Gestational Age at Delivery: [redacted]w[redacted]d   Antepartum complications:  1. Chronic migraines on prophy, Propanolol per [facility].  2. 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  3. Initial screening TSH low, repeat WNL  Admitting Diagnosis: IOL for postdates pregnancy  Secondary Diagnosis: Patient Active Problem List   Diagnosis Date Noted  . Post-term pregnancy, 40-42 weeks of gestation 10/23/2020  . Uterine contractions during pregnancy 10/19/2020  . ABNORMALITY OF GAIT 06/06/2010  . PATELLAR DISLOCATION, RIGHT 06/06/2010    Augmentation: Pitocin and Cytotec Complications: None Intrapartum complications/course: Toni Foster presented to L&D for a scheduled IOL for postdates pregnancy.  She was actively managed with misoprostol and oxytocin.  She progressed well after SROM and was comfortable with an epidural.  She had a protracted active phase with cervical edema.  Oxytocin was turned off d/t NRFHR that resolved with conservative measures.  IV benadryl was given and frequent positioning utilized to help with rotation from OP to ROA.  She had a strong urge to push but still had a thin anterior lip present.  She was able to push to C/C/+1 with approximately 3 contractions.  She continued to push effectively for approximately 2 hours. Delivery Type: spontaneous vaginal delivery Anesthesia: epidural Placenta: spontaneous Laceration: 1st degree hymenal laceration - not repaired, hemostatic, approximates well Episiotomy: none Newborn Data: Live born female "Ace" Birth Weight: 7 lb 15 oz (3600 g) APGAR: 8, 9  Newborn Delivery   Birth date/time: 10/24/2020 14:50:00 Delivery type:  Vaginal, Spontaneous      Postpartum Procedures: None Edinburgh:  Edinburgh Postnatal Depression Scale Screening Tool 10/24/2020  I have been able to laugh and see the funny side of things. 0  I have looked forward with enjoyment to things. 1  I have blamed myself unnecessarily when things went wrong. 0  I have been anxious or worried for no good reason. 1  I have felt scared or panicky for no good reason. 0  Things have been getting on top of me. 1  I have been so unhappy that I have had difficulty sleeping. 0  I have felt sad or miserable. 1  I have been so unhappy that I have been crying. 1  The thought of harming myself has occurred to me. 0  Edinburgh Postnatal Depression Scale Total 5     Post partum course:  Patient had an uncomplicated postpartum course.  By time of discharge on PPD#1, her pain was controlled on oral pain medications; she had appropriate lochia and was ambulating, voiding without difficulty and tolerating regular diet.  TOC team was consulted prior to discharge to assess for needs and review postpartum depression risk factors. Will plan for postpartum mood check in 2 weeks. She was deemed stable for discharge to home.     Discharge Physical Exam:  BP (!) 122/96 (BP Location: Right Arm)   Pulse 80   Temp 97.9 F (36.6 C)   Resp 20   Ht 5\' 2"  (1.575 m)   Wt 83 kg   SpO2 99%   Breastfeeding Unknown   BMI 33.47 kg/m   General: NAD CV: RRR Pulm: CTABL, nl effort ABD: s/nd/nt, fundus firm and below the umbilicus Lochia: moderate Perineum: minimal edema/laceration hemostatic DVT Evaluation:  LE non-ttp, no evidence of DVT on exam.  Hemoglobin  Date Value Ref Range Status  10/25/2020 10.5 (L) 12.0 - 15.0 g/dL Final   HGB  Date Value Ref Range Status  08/23/2012 14.5 12.0 - 16.0 g/dL Final   HCT  Date Value Ref Range Status  10/25/2020 31.9 (L) 36.0 - 46.0 % Final  08/23/2012 43.2 35.0 - 47.0 % Final     Disposition: stable, discharge to  home. Baby Feeding: breastmilk Baby Disposition: home with mom  Rh Immune globulin given: Rh pos Rubella vaccine given: Immune Varivax vaccine given: Immune Flu vaccine given in AP or PP setting: given 08/01/2020 Tdap vaccine given in AP or PP setting: given 08/18/2020  Contraception: Nexplanon   Prenatal Labs:  Blood type/Rh A Pos  Antibody screen neg  Rubella Immune  Varicella Immune  RPR NR  HBsAg Neg  HIV NR  GC neg  Chlamydia neg  Genetic screening negative  1 hour GTT  127  3 hour GTT   GBS  Negative    Plan:  Toni Foster was discharged to home in good condition. Follow-up appointment for mood check in 2 weeks and with delivering provider in 6 weeks.  Discharge Medications: Allergies as of 10/25/2020   No Known Allergies     Medication List    TAKE these medications   acetaminophen 500 MG tablet Commonly known as: TYLENOL Take 2 tablets (1,000 mg total) by mouth every 6 (six) hours as needed (for pain scale < 4).   diphenhydrAMINE 12.5 MG chewable tablet Commonly known as: BENADRYL Chew 12.5 mg by mouth 4 (four) times daily as needed for allergies.   ibuprofen 600 MG tablet Commonly known as: ADVIL Take 1 tablet (600 mg total) by mouth every 6 (six) hours as needed for mild pain or moderate pain.   loratadine 10 MG tablet Commonly known as: CLARITIN Take 10 mg by mouth daily.   multivitamin-prenatal 27-0.8 MG Tabs tablet Take 1 tablet by mouth daily at 12 noon.   propranolol 60 MG tablet Commonly known as: INDERAL Take 60 mg by mouth 3 (three) times daily.        Follow-up Information    Gustavo Lah, CNM. Schedule an appointment as soon as possible for a visit in 6 week(s).   Specialty: Certified Nurse Midwife Why: postpartum visit.  Please let us know if you would like a Nexplanon placed during your appointment so we can order one for you.  Contact information: [address] [place] [address] [phone]         [facility]. Schedule an appointment as soon as possible for a visit in 2 week(s).   Why: postpartum mood check Contact information: [address] [place] [address] [phone]              Signed:  Margaretmary Eddy, CNM Certified Nurse Midwife [facility]

## 2020-10-24 NOTE — Progress Notes (Signed)
Labor Progress Note  Toni Foster is a 30 y.o. G1P0 at [redacted]w[redacted]d by ultrasound admitted for induction of labor due to Post dates. Due date 10/18/20.  CNM called to room to assess FHR tracing, variable decels intermittently, not able to trace UCs well to determine early vs late decels.   Subjective: pt mostly comfortable with epidural.   Objective: BP (!) 148/80   Pulse 94   Temp 98 F (36.7 C) (Oral)   Resp 16   Ht 5\' 2"  (1.575 m)   Wt 83 kg   SpO2 99%   BMI 33.47 kg/m  Notable VS details: reviewed, has been normotensive except for 4 BP taken from 0235 until 0241. Per Nursing- pt was shaking/shivering during elevated BP.   Fetal Assessment: FHT:  FHR: 115 bpm, variability: moderate,  accelerations:  Present,  decelerations:  Present variable decels.  Category/reactivity:  Category II UC:   regular, every 2 minutes, Pitocin at 35mu/min SVE:   7-8/90/-1, bloody show - IUPC and FSE placed to more accurately trace.   Membrane status: light meconium stained fluid, SROM at 2024   Labs: Lab Results  Component Value Date   WBC 11.6 (H) 10/23/2020   HGB 11.6 (L) 10/23/2020   HCT 34.6 (L) 10/23/2020   MCV 95.6 10/23/2020   PLT 226 10/23/2020    Assessment / Plan: Induction of labor due to postterm,  progressing well on pitocin  Labor: Progressing well, s/p cytotec x 2, now SROM and Pitocin infusing.  decrease Pitocin now for Cat II tracing, maternal repositioning.  Preeclampsia:  no e/o Pre-e Fetal Wellbeing:  Category II Pain Control:  Epidural I/D:  n/a Anticipated MOD:  NSVD  10/25/2020 Stevin Bielinski, CNM 10/24/2020, 3:15 AM

## 2020-10-25 LAB — CBC
HCT: 31.9 % — ABNORMAL LOW (ref 36.0–46.0)
Hemoglobin: 10.5 g/dL — ABNORMAL LOW (ref 12.0–15.0)
MCH: 31.7 pg (ref 26.0–34.0)
MCHC: 32.9 g/dL (ref 30.0–36.0)
MCV: 96.4 fL (ref 80.0–100.0)
Platelets: 171 10*3/uL (ref 150–400)
RBC: 3.31 MIL/uL — ABNORMAL LOW (ref 3.87–5.11)
RDW: 14.2 % (ref 11.5–15.5)
WBC: 13 10*3/uL — ABNORMAL HIGH (ref 4.0–10.5)
nRBC: 0 % (ref 0.0–0.2)

## 2020-10-25 MED ORDER — ACETAMINOPHEN 500 MG PO TABS: 1000.0000 mg | ORAL_TABLET | Freq: Four times a day (QID) | ORAL | 0 refills | Status: DC | PRN

## 2020-10-25 MED ORDER — IBUPROFEN 600 MG PO TABS: 600.0000 mg | ORAL_TABLET | Freq: Four times a day (QID) | ORAL | 0 refills | Status: DC | PRN

## 2020-10-25 MED ORDER — PROPRANOLOL HCL ER 60 MG PO CP24
60.0000 mg | ORAL_CAPSULE | Freq: Every day | ORAL | Status: DC
  Administered 2020-10-25: 60 mg via ORAL
  Filled 2020-10-25: qty 1

## 2020-10-25 NOTE — Progress Notes (Signed)
Patient discharged home with infant. FOB and patient's sister at bedside for discharge. Discharge instructions and prescriptions given and reviewed with patient. Patient verbalized understanding.   Follow-up appointment for 2-weeks and 6-weeks scheduled for patient.   Escorted out by volunteers.

## 2020-10-25 NOTE — TOC Initial Note (Signed)
Transition of Care [facility]) - Initial/Assessment Note    Patient Details  Name: Toni Foster MRN: [id number] Date of Birth: [birth year]  Transition of Care [facility]) CM/SW Contact:    [place] Cellar, RN Phone Number: 10/25/2020, 1:16 PM  Clinical Narrative:                 Spoke to patient who is eager to discharge home with strong family support. FOB is highly involved and supportive as well as patients sister who also recently had a baby. Has all needed equipment including car seat and breast pump. Patient is planning to breastfeed solely. No needs for transportation or [facility] services although patient does know how to outreach to [facility] if needed. Patient will return to work in 6 weeks and family will provide childcare. Discussed PPD and history of depression in detail. Patient states she has been stable for over 5 years with her depression however remains involved with her therapist. Thornell Sartorius will be patient of [facility]. Patient reports no needs or concerns and excited to be a new mom and discharge home with family.         Patient Goals and CMS Choice        Expected Discharge Plan and Services           Expected Discharge Date: 10/25/20                                    Prior Living Arrangements/Services                       Activities of Daily Living Home Assistive Devices/Equipment: None ADL Screening (condition at time of admission) Patient's cognitive ability adequate to safely complete daily activities?: Yes Is the patient deaf or have difficulty hearing?: No Does the patient have difficulty seeing, even when wearing glasses/contacts?: No Does the patient have difficulty concentrating, remembering, or making decisions?: No Patient able to express need for assistance with ADLs?: Yes Does the patient have difficulty dressing or bathing?: No Independently performs ADLs?: Yes (appropriate for developmental age) Walks in Home: Independent Does  the patient have difficulty walking or climbing stairs?: No Weakness of Legs: None Weakness of Arms/Hands: None  Permission Sought/Granted                  Emotional Assessment              Admission diagnosis:  Post-term pregnancy, 40-42 weeks of gestation [O48.0] Patient Active Problem List   Diagnosis Date Noted  . Post-term pregnancy, 40-42 weeks of gestation 10/23/2020  . Uterine contractions during pregnancy 10/19/2020  . ABNORMALITY OF GAIT 06/06/2010  . PATELLAR DISLOCATION, RIGHT 06/06/2010   PCP:  Marguarite Arbour, MD Pharmacy:   [facility] [phone] Nicholes Rough, [facility] - [address] [place] [address] Phone: [phone] Fax: [phone]     Social Determinants of Health (SDOH) Interventions    Readmission Risk Interventions No flowsheet data found.

## 2020-10-25 NOTE — Anesthesia Postprocedure Evaluation (Signed)
Anesthesia Post Note  Patient: Toni Foster  Procedure(s) Performed: AN AD HOC LABOR EPIDURAL  Anesthesia Type: Epidural Anesthetic complications: no   No complications documented.   Last Vitals:  Vitals:   10/25/20 0300 10/25/20 0806  BP: 115/82 (!) 122/96  Pulse: 70 80  Resp: 18 20  Temp: 36.6 C 36.6 C  SpO2: 100% 99%    Last Pain:  Vitals:   10/25/20 0300  TempSrc: Oral  PainSc:                  [name]

## 2020-10-25 NOTE — Progress Notes (Signed)
Postpartum Day  1  Subjective: no complaints, up ad lib, voiding and tolerating PO  Doing well, no concerns. Ambulating without difficulty, pain managed with PO meds, tolerating regular diet, and voiding without difficulty.   No fever/chills, chest pain, shortness of breath, nausea/vomiting, or leg pain. No nipple or breast pain. No headache, visual changes, or RUQ/epigastric pain.  Objective: BP (!) 122/96 (BP Location: Right Arm)   Pulse 80   Temp 97.9 F (36.6 C)   Resp 20   Ht 5\' 2"  (1.575 m)   Wt 83 kg   SpO2 99%   Breastfeeding Unknown   BMI 33.47 kg/m    Physical Exam:  General: alert, cooperative and no distress Breasts: soft/nontender CV: RRR Pulm: nl effort, CTABL Abdomen: soft, non-tender, active bowel sounds Uterine Fundus: firm Perineum: minimal edema, laceration hemostatic Lochia: appropriate DVT Evaluation: No evidence of DVT seen on physical exam.  Recent Labs    10/23/20 0557 10/25/20 0554  HGB 11.6* 10.5*  HCT 34.6* 31.9*  WBC 11.6* 13.0*  PLT 226 171    Assessment/Plan: 30 y.o. G1P1001 postpartum day # 1  -Continue routine postpartum care -Lactation consult PRN for breastfeeding -Discussed contraceptive options including implant, IUDs hormonal and non-hormonal, injection, pills/ring/patch, condoms, and NFP. Desires Nexplanon.  -Acute blood loss anemia - hemodynamically stable and asymptomatic; start PO ferrous sulfate BID with stool softeners  -Immunization status:   all immunizations up to date   Disposition: Desires discharge home today   LOS: 2 days   34, Gustavo Lah 10/25/2020, 9:19 AM   ----- 10/27/2020  Certified Nurse Midwife [facility]

## 2020-10-25 NOTE — Anesthesia Postprocedure Evaluation (Signed)
Anesthesia Post Note  Patient: Toni Foster  Procedure(s) Performed: AN AD HOC LABOR EPIDURAL  Patient location during evaluation: Mother Baby Anesthesia Type: Epidural Level of consciousness: awake and alert Pain management: pain level controlled Vital Signs Assessment: post-procedure vital signs reviewed and stable Respiratory status: spontaneous breathing, nonlabored ventilation and respiratory function stable Cardiovascular status: stable Postop Assessment: no headache, no backache and epidural receding Anesthetic complications: no   No complications documented.   Last Vitals:  Vitals:   10/25/20 0300 10/25/20 0806  BP: 115/82 (!) 122/96  Pulse: 70 80  Resp: 18 20  Temp: 36.6 C 36.6 C  SpO2: 100% 99%    Last Pain:  Vitals:   10/25/20 0300  TempSrc: Oral  PainSc:                  [name]

## 2020-10-25 NOTE — Lactation Note (Signed)
This note was copied from a baby's chart. Lactation Consultation Note  Patient Name: Toni Foster IRCVE'L Date: 10/25/2020 Reason for consult: Follow-up assessment;1st time breastfeeding;Term Age:28 hours  Lactation at bedside to assist with feeding attempt before circumcision. Baby had a few good feeds, and has had some periods without feeding. Mom is G1P1, SVD 18 hours ago. Overall feeding plan is to pump and bottle feed.  LC assisted with baby position, alignment, and latch. Baby wake and alert, but not overly eager for feeding. Mom had some noted discomfort, but improved with second attempt at latch with wider open mouth. Strong rhythmic suck pattern was started with 1 identified swallow; at this time pediatrician requested baby in nursery for circumcision, baby was removed from breast and taken to nursery by support staff.  LC spoke with mom about her feeding plan, and provided guidance on pumping initiation, process, and importance of consistency and frequency. Mom verbalizes understanding. Encouraged to continue with feeding at breast for initial stimulation and onset of milk production.   Information for outpatient lactation services and community breastfeeding resources provided. Encouraged to call today when baby returns to room for feeding support or with questions/concerns.  Maternal Data Has patient been taught Hand Expression?: Yes Does the patient have breastfeeding experience prior to this delivery?: No  Feeding Mother's Current Feeding Choice: Breast Milk  LATCH Score Latch: Grasps breast easily, tongue down, lips flanged, rhythmical sucking.  Audible Swallowing: A few with stimulation  Type of Nipple: Everted at rest and after stimulation  Comfort (Breast/Nipple): Soft / non-tender  Hold (Positioning): Assistance needed to correctly position infant at breast and maintain latch. (mom had a little discomfort)  LATCH Score: 8   Lactation Tools Discussed/Used     Interventions Interventions: Breast feeding basics reviewed;Assisted with latch;Hand express;Breast compression;Adjust position;Support pillows;Position options;Education;Hand pump;DEBP  Discharge Discharge Education: Warning signs for feeding baby;Engorgement and breast care;Outpatient recommendation Pump: Manual;Personal (through insurance)  Consult Status Consult Status: PRN    WILBURTA MILBOURN 10/25/2020, 8:52 AM

## 2020-10-25 NOTE — Discharge Instructions (Signed)
Discharge Instructions:   Follow-up Appointment:  2-week Postpartum Follow-up: Wednesday, April 13th at 10:45am with Margaretmary Eddy, CNM at [facility]!  6-week postpartum follow-up with Nexplanon insertion: Tuesday, May 10th at 9:45am with Margaretmary Eddy, CNM at [facility]!  If there are any new medications, they have been ordered and will be available for pickup at the listed pharmacy on your way home from the hospital.   Call office if you have any of the following: headache, visual changes, fever >101.0 F, chills, shortness of breath, breast concerns, excessive vaginal bleeding, incision drainage or problems, leg pain or redness, depression or any other concerns. If you have vaginal discharge with an odor, let your doctor know.   It is normal to bleed for up to 6 weeks. You should not soak through more than 1 pad in 1 hour. If you have a blood clot larger than your fist with continued bleeding, call your doctor.   Activity: Do not lift > 10 lbs for 6 weeks (do not lift anything heavier than your baby). No intercourse, tampons, swimming pools, hot tubs, baths (only showers) for 6 weeks.  No driving for 1-2 weeks. Continue prenatal vitamin, especially if breastfeeding. Increase calories and fluids (water) while breastfeeding.   Your milk will come in, in the next couple of days (right now it is colostrum). You may have a slight fever when your milk comes in, but it should go away on its own.  If it does not, and rises above 101 F please call the doctor. You will also feel achy and your breasts will be firm. They will also start to leak. If you are breastfeeding, continue as you have been and you can pump/express milk for comfort.   If you have too much milk, your breasts can become engorged, which could lead to mastitis. This is an infection of the milk ducts. It can be very painful and you will need to notify your doctor to obtain a prescription for antibiotics. You can also treat it with a  shower or hot/cold compress.   For concerns about your baby, please call your pediatrician.  For breastfeeding concerns, the lactation consultant can be reached at [phone].   Postpartum blues (feelings of happy one minute and sad another minute) are normal for the first few weeks but if it gets worse let your doctor know.   Congratulations! We enjoyed caring for you and your new bundle of joy!    Vaginal Delivery, Care After Refer to this sheet in the next few weeks. These discharge instructions provide you with information on caring for yourself after delivery. Your caregiver may also give you specific instructions. Your treatment has been planned according to the most current medical practices available, but problems sometimes occur. Call your caregiver if you have any problems or questions after you go home. HOME CARE INSTRUCTIONS 1. Take over-the-counter or prescription medicines only as directed by your caregiver or pharmacist. 2. Do not drink alcohol, especially if you are breastfeeding or taking medicine to relieve pain. 3. Do not smoke tobacco. 4. Continue to use good perineal care. Good perineal care includes: 1. Wiping your perineum from back to front 2. Keeping your perineum clean. 3. You can do sitz baths twice a day, to help keep this area clean 5. Do not use tampons, douche or have sex until your caregiver says it is okay. 6. Shower only and avoid sitting in submerged water, aside from sitz baths 7. Wear a well-fitting bra that provides  breast support. 8. Eat healthy foods. 9. Drink enough fluids to keep your urine clear or pale yellow. 10. Eat high-fiber foods such as whole grain cereals and breads, brown rice, beans, and fresh fruits and vegetables every day. These foods may help prevent or relieve constipation. 11. Avoid constipation with high fiber foods or medications, such as miralax or metamucil 12. Follow your caregiver's recommendations regarding resumption of  activities such as climbing stairs, driving, lifting, exercising, or traveling. 13. Talk to your caregiver about resuming sexual activities. Resumption of sexual activities is dependent upon your risk of infection, your rate of healing, and your comfort and desire to resume sexual activity. 14. Try to have someone help you with your household activities and your newborn for at least a few days after you leave the hospital. 15. Rest as much as possible. Try to rest or take a nap when your newborn is sleeping. 16. Increase your activities gradually. 17. Keep all of your scheduled postpartum appointments. It is very important to keep your scheduled follow-up appointments. At these appointments, your caregiver will be checking to make sure that you are healing physically and emotionally. SEEK MEDICAL CARE IF:   You are passing large clots from your vagina. Save any clots to show your caregiver.  You have a foul smelling discharge from your vagina.  You have trouble urinating.  You are urinating frequently.  You have pain when you urinate.  You have a change in your bowel movements.  You have increasing redness, pain, or swelling near your vaginal incision (episiotomy) or vaginal tear.  You have pus draining from your episiotomy or vaginal tear.  Your episiotomy or vaginal tear is separating.  You have painful, hard, or reddened breasts.  You have a severe headache.  You have blurred vision or see spots.  You feel sad or depressed.  You have thoughts of hurting yourself or your newborn.  You have questions about your care, the care of your newborn, or medicines.  You are dizzy or light-headed.  You have a rash.  You have nausea or vomiting.  You were breastfeeding and have not had a menstrual period within 12 weeks after you stopped breastfeeding.  You are not breastfeeding and have not had a menstrual period by the 12th week after delivery.  You have a fever. SEEK  IMMEDIATE MEDICAL CARE IF:   You have persistent pain.  You have chest pain.  You have shortness of breath.  You faint.  You have leg pain.  You have stomach pain.  Your vaginal bleeding saturates two or more sanitary pads in 1 hour. MAKE SURE YOU:   Understand these instructions.  Will watch your condition.  Will get help right away if you are not doing well or get worse. Document Released: 07/13/2000 Document Revised: 11/30/2013 Document Reviewed: 03/12/2012 [facility] Patient Information 2015 [place], [place]. This information is not intended to replace advice given to you by your health care provider. Make sure you discuss any questions you have with your health care provider.  Sitz Bath A sitz bath is a warm water bath taken in the sitting position. The water covers only the hips and butt (buttocks). We recommend using one that fits in the toilet, to help with ease of use and cleanliness. It may be used for either healing or cleaning purposes. Sitz baths are also used to relieve pain, itching, or muscle tightening (spasms). The water may contain medicine. Moist heat will help you heal and relax.  HOME  CARE  Take 3 to 4 sitz baths a day. 18. Fill the bathtub half-full with warm water. 19. Sit in the water and open the drain a little. 20. Turn on the warm water to keep the tub half-full. Keep the water running constantly. 21. Soak in the water for 15 to 20 minutes. 22. After the sitz bath, pat the affected area dry. GET HELP RIGHT AWAY IF: You get worse instead of better. Stop the sitz baths if you get worse. MAKE SURE YOU:  Understand these instructions.  Will watch your condition.  Will get help right away if you are not doing well or get worse. Document Released: 08/23/2004 Document Revised: 04/09/2012 Document Reviewed: 11/13/2010 [facility] Patient Information 2015 [place], [place]. This information is not intended to replace advice given to you by your health care  provider. Make sure you discuss any questions you have with your health care provider.

## 2021-04-21 ENCOUNTER — Encounter: Payer: Self-pay | Admitting: Emergency Medicine

## 2021-04-21 ENCOUNTER — Emergency Department
Admission: EM | Admit: 2021-04-21 | Discharge: 2021-04-21 | Disposition: A | Discharge status: Home / Self Care | Payer: BC Managed Care – PPO | Attending: Emergency Medicine | Admitting: Emergency Medicine

## 2021-04-21 ENCOUNTER — Other Ambulatory Visit: Payer: Self-pay

## 2021-04-21 DIAGNOSIS — S301XXA Contusion of abdominal wall, initial encounter: Secondary | ICD-10-CM | POA: Insufficient documentation

## 2021-04-21 DIAGNOSIS — Y92410 Unspecified street and highway as the place of occurrence of the external cause: Secondary | ICD-10-CM | POA: Insufficient documentation

## 2021-04-21 DIAGNOSIS — S161XXA Strain of muscle, fascia and tendon at neck level, initial encounter: Secondary | ICD-10-CM | POA: Diagnosis not present

## 2021-04-21 DIAGNOSIS — S199XXA Unspecified injury of neck, initial encounter: Secondary | ICD-10-CM | POA: Diagnosis present
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
## 2021-11-22 ENCOUNTER — Ambulatory Visit
Admission: EM | Admit: 2021-11-22 | Discharge: 2021-11-22 | Disposition: A | Discharge status: Home / Self Care | Payer: BC Managed Care – PPO | Attending: Emergency Medicine | Admitting: Emergency Medicine

## 2021-11-22 DIAGNOSIS — J0100 Acute maxillary sinusitis, unspecified: Secondary | ICD-10-CM | POA: Diagnosis not present

## 2021-11-22 MED ORDER — FLUTICASONE PROPIONATE 50 MCG/ACT NA SUSP: 2.0000 | Freq: Every day | NASAL | 0 refills | Status: DC

## 2021-11-22 MED ORDER — AMOXICILLIN-POT CLAVULANATE 875-125 MG PO TABS: 1.0000 | ORAL_TABLET | Freq: Two times a day (BID) | ORAL | 0 refills | Status: DC

## 2021-11-22 MED ORDER — IBUPROFEN 600 MG PO TABS: 600.0000 mg | ORAL_TABLET | Freq: Four times a day (QID) | ORAL | 0 refills | Status: DC | PRN

## 2021-11-22 NOTE — ED Provider Notes (Signed)
HPI ? ?SUBJECTIVE: ? ?Toni Foster is a 31 y.o. female who presents with 1.5 weeks of frontal sinus headache, sinus pain and pressure, cough productive of green mucus, bilateral ear pain, postnasal drip and sore throat.  She reports crusting on her right eyes starting this morning.  No change in hearing, otorrhea, vertigo.  No fevers, body aches, facial swelling, dental pain, wheezing or shortness of breath, dyspnea on exertion, allergy symptoms.  No antibiotics in the past month.  She has been taking over-the-counter cold and sinus medication with improvement in her symptoms and has also tried Mucinex and lozenges.  Symptoms are worse when she blows her nose.  She has an antipyretic within 6 hours of evaluation.  No double sickening, strep exposure.  She is status post tonsillectomy.  LMP: Has a Nexplanon.  Denies the possibility being pregnant.  [facility]P: [facility] ? ?Past Medical History:  ?Diagnosis Date  ? Medical history non-contributory   ? ? ?Past Surgical History:  ?Procedure Laterality Date  ? ANTERIOR CRUCIATE LIGAMENT REPAIR    ? TONSILLECTOMY    ? WISDOM TOOTH EXTRACTION    ? ? ?History reviewed. No pertinent family history. ? ?Social History  ? ?Tobacco Use  ? Smoking status: Never  ? Smokeless tobacco: Never  ?Substance Use Topics  ? Alcohol use: No  ? Drug use: Never  ? ? ?No current facility-administered medications for this encounter. ? ?Current Outpatient Medications:  ?  amoxicillin-clavulanate (AUGMENTIN) 875-125 MG tablet, Take 1 tablet by mouth 2 (two) times daily. X 7 days, Disp: 14 tablet, Rfl: 0 ?  amphetamine-dextroamphetamine (ADDERALL XR) 30 MG 24 hr capsule, Take by mouth., Disp: , Rfl:  ?  etonogestrel (NEXPLANON) 68 MG IMPL implant, Inject into the skin., Disp: , Rfl:  ?  fluticasone (FLONASE) 50 MCG/ACT nasal spray, Place 2 sprays into both nostrils daily., Disp: 16 g, Rfl: 0 ?  ibuprofen (ADVIL) 600 MG tablet, Take 1 tablet (600 mg total) by mouth every 6 (six) hours as  needed., Disp: 30 tablet, Rfl: 0 ?  propranolol (INDERAL) 60 MG tablet, Take 60 mg by mouth 3 (three) times daily., Disp: , Rfl:  ?  sertraline (ZOLOFT) 50 MG tablet, Take 75 mg by mouth daily., Disp: , Rfl:  ?  acetaminophen (TYLENOL) 500 MG tablet, Take 2 tablets (1,000 mg total) by mouth every 6 (six) hours as needed (for pain scale < 4)., Disp: 30 tablet, Rfl: 0 ?  Prenatal Vit-Fe Fumarate-FA (MULTIVITAMIN-PRENATAL) 27-0.8 MG TABS tablet, Take 1 tablet by mouth daily at 12 noon., Disp: , Rfl:  ? ?Allergies  ?Allergen Reactions  ? Wheat Bran Other (See Comments)  ? ? ? ?ROS ? ?As noted in HPI.  ? ?Physical Exam ? ?BP 119/87 (BP Location: Left Arm)   Pulse 70   Temp 98.4 ?F (36.9 ?C) (Oral)   Resp 18   SpO2 100%  ? ?Constitutional: Well developed, well nourished, no acute distress ?Eyes:  EOMI, conjunctiva normal bilaterally ?HENT: Normocephalic, atraumatic,mucus membranes moist.  TMs normal bilaterally.  Positive purulent nasal congestion.  Erythematous, swollen turbinates.  Positive bilateral maxillary sinus tenderness.  No frontal sinus tenderness.  Tonsils surgically absent.  Normal oropharynx.  Positive postnasal drip. ?Neck: No cervical lymphadenopathy ?Respiratory: Normal inspiratory effort, lungs clear bilaterally ?Cardiovascular: Normal rate, regular rhythm, no murmurs, rubs, gallops ?GI: nondistended ?skin: No rash, skin intact ?Musculoskeletal: no deformities ?Neurologic: Alert & oriented x 3, no focal neuro deficits ?Psychiatric: Speech and behavior appropriate ? ? ?ED  Course ? ? ?Medications - No data to display ? ?No orders of the defined types were placed in this encounter. ? ? ?No results found for this or any previous visit (from the past 24 hour(s)). ?No results found. ? ?ED Clinical Impression ? ?1. Acute non-recurrent maxillary sinusitis   ?  ? ?ED Assessment/Plan ? ?Did not do COVID or flu testing as she is out of the treatment window for both.  Did not check strep as her throat appears  normal, suspect it is from the postnasal drip.  Concern for acute maxillary sinusitis, and given duration of symptoms and history of double sickening, will send home with antibiotics..  She has no evidence of an otitis media.  Home with Augmentin, Mucinex D, Flonase, saline nasal irrigation, Tylenol/ibuprofen.  Discontinue other cold medications.  Follow-up with PCP if not better after finishing the Augmentin. ? ?Discussed MDM, treatment plan, and plan for follow-up with patient.. patient agrees with plan.  ? ?Meds ordered this encounter  ?Medications  ? amoxicillin-clavulanate (AUGMENTIN) 875-125 MG tablet  ?  Sig: Take 1 tablet by mouth 2 (two) times daily. X 7 days  ?  Dispense:  14 tablet  ?  Refill:  0  ? ibuprofen (ADVIL) 600 MG tablet  ?  Sig: Take 1 tablet (600 mg total) by mouth every 6 (six) hours as needed.  ?  Dispense:  30 tablet  ?  Refill:  0  ? fluticasone (FLONASE) 50 MCG/ACT nasal spray  ?  Sig: Place 2 sprays into both nostrils daily.  ?  Dispense:  16 g  ?  Refill:  0  ? ? ? ? ?*This clinic note was created using Scientist, clinical (histocompatibility and immunogenetics). Therefore, there may be occasional mistakes despite careful proofreading. ? ?? ? ?  ?Domenick Gong, MD ?11/23/21 1837 ? ?

## 2021-11-22 NOTE — ED Triage Notes (Signed)
Pt here with C/O ear pain, cough, nasal congestion, facial pain, crusty eye for 1 week.  ?

## 2021-11-22 NOTE — Discharge Instructions (Addendum)
Finish the Augmentin even if you feel better, Mucinex D, Flonase, saline nasal irrigation with a Milta Deiters Med rinse and distilled water as often as you want, 500 to 1000 mg of Tylenol combined with 600 mg of ibuprofen 3-4 times a day as needed for headache, pain..  Discontinue other cold medications.  Follow-up with PCP if not better after finishing the Augmentin.  ? ?Some people find salt water gargles and  Traditional Medicinal's "Throat Coat" tea helpful. Take 5 mL of liquid Benadryl and 5 mL of Maalox. Mix it together, and then hold it in your mouth for as long as you can and then swallow. You may do this 4 times a day.   ? ?Go to www.goodrx.com  or www.costplusdrugs.com to look up your medications. This will give you a list of where you can find your prescriptions at the most affordable prices. Or ask the pharmacist what the cash price is, or if they have any other discount programs available to help make your medication more affordable. This can be less expensive than what you would pay with insurance.   ?

## 2022-11-07 ENCOUNTER — Ambulatory Visit
Admission: RE | Admit: 2022-11-07 | Discharge: 2022-11-07 | Disposition: A | Discharge status: Home / Self Care | Payer: BC Managed Care – PPO | Source: Ambulatory Visit

## 2022-11-07 VITALS — BP 116/81 | HR 82 | Temp 99.1°F | Resp 16

## 2022-11-07 DIAGNOSIS — J019 Acute sinusitis, unspecified: Secondary | ICD-10-CM | POA: Diagnosis not present

## 2022-11-07 DIAGNOSIS — B9689 Other specified bacterial agents as the cause of diseases classified elsewhere: Secondary | ICD-10-CM | POA: Diagnosis not present

## 2022-11-07 MED ORDER — AMOXICILLIN-POT CLAVULANATE 875-125 MG PO TABS
1.0000 | ORAL_TABLET | Freq: Two times a day (BID) | ORAL | 0 refills | Status: DC
Start: 2022-11-07 — End: 2022-11-27

## 2022-11-07 MED ORDER — PREDNISONE 50 MG PO TABS
50.0000 mg | ORAL_TABLET | Freq: Every day | ORAL | 0 refills | Status: AC
Start: 2022-11-07 — End: 2022-11-12

## 2022-11-07 NOTE — Discharge Instructions (Signed)
Follow up here or with your primary care provider if your symptoms are worsening or not improving with treatment.     

## 2022-11-07 NOTE — ED Triage Notes (Signed)
Pt c/o severe pressure in her head, sinus pain, congestion, cough and sore throat x 1 week. Pt has tried OTC medication for symptoms relief.

## 2022-11-07 NOTE — ED Provider Notes (Signed)
[facility]    CSN: [id number] Arrival date & time: 11/07/22  1643      History   Chief Complaint Chief Complaint  Patient presents with   Nasal Congestion    Sinus infection - Entered by patient    HPI Toni Foster is a 32 y.o. female.   HPI  Patient presents to urgent care with concern for sinus infections.  She endorses 1+ week history of "severe" sinus pain (8/10) and pressure, cough, headache.  Past Medical History:  Diagnosis Date   Medical history non-contributory     Patient Active Problem List   Diagnosis Date Noted   Post-term pregnancy, 40-42 weeks of gestation 10/23/2020   Uterine contractions during pregnancy 10/19/2020   ABNORMALITY OF GAIT 06/06/2010   PATELLAR DISLOCATION, RIGHT 06/06/2010    Past Surgical History:  Procedure Laterality Date   ANTERIOR CRUCIATE LIGAMENT REPAIR     TONSILLECTOMY     WISDOM TOOTH EXTRACTION      OB History     Gravida  1   Para  1   Term  1   Preterm      AB      Living  1      SAB      IAB      Ectopic      Multiple  0   Live Births  1            Home Medications    Prior to Admission medications   Medication Sig Start Date End Date Taking? Authorizing Provider  acetaminophen (TYLENOL) 500 MG tablet Take 2 tablets (1,000 mg total) by mouth every 6 (six) hours as needed (for pain scale < 4). 10/25/20   Gustavo Lah, CNM  amoxicillin-clavulanate (AUGMENTIN) 875-125 MG tablet Take 1 tablet by mouth 2 (two) times daily. X 7 days 11/22/21   Domenick Gong, MD  amphetamine-dextroamphetamine (ADDERALL XR) 30 MG 24 hr capsule Take by mouth. 01/23/21 12/18/21  [provider]  etonogestrel (NEXPLANON) 68 MG IMPL implant Inject into the skin.    [provider]  fluticasone (FLONASE) 50 MCG/ACT nasal spray Place 2 sprays into both nostrils daily. 11/22/21   Domenick Gong, MD  ibuprofen (ADVIL) 600 MG tablet Take 1 tablet (600 mg total) by mouth every 6  (six) hours as needed. 11/22/21   Domenick Gong, MD  Prenatal Vit-Fe Fumarate-FA (MULTIVITAMIN-PRENATAL) 27-0.8 MG TABS tablet Take 1 tablet by mouth daily at 12 noon.    [provider]  propranolol (INDERAL) 60 MG tablet Take 60 mg by mouth 3 (three) times daily.    [provider]  sertraline (ZOLOFT) 50 MG tablet Take 75 mg by mouth daily. 11/06/21   [provider]    Family History No family history on file.  Social History Social History   Tobacco Use   Smoking status: Never   Smokeless tobacco: Never  Substance Use Topics   Alcohol use: No   Drug use: Never     Allergies   Wheat   Review of Systems Review of Systems   Physical Exam Triage Vital Signs ED Triage Vitals  Enc Vitals Group     BP      Pulse      Resp      Temp      Temp src      SpO2      Weight      Height      Head Circumference  Peak Flow      Pain Score      Pain Loc      Pain Edu?      Excl. in GC?    No data found.  Updated Vital Signs There were no vitals taken for this visit.  Visual Acuity Right Eye Distance:   Left Eye Distance:   Bilateral Distance:    Right Eye Near:   Left Eye Near:    Bilateral Near:     Physical Exam Vitals reviewed.  Constitutional:      Appearance: Normal appearance. She is ill-appearing.  HENT:     Nose:     Right Sinus: Maxillary sinus tenderness and frontal sinus tenderness present.     Left Sinus: Maxillary sinus tenderness and frontal sinus tenderness present.     Mouth/Throat:     Pharynx: Posterior oropharyngeal erythema present. No oropharyngeal exudate.  Cardiovascular:     Rate and Rhythm: Normal rate and regular rhythm.     Pulses: Normal pulses.     Heart sounds: Normal heart sounds.  Pulmonary:     Effort: Pulmonary effort is normal.     Breath sounds: Normal breath sounds.  Skin:    General: Skin is warm and dry.  Neurological:     General: No focal deficit present.     Mental Status:  She is alert and oriented to person, place, and time.  Psychiatric:        Mood and Affect: Mood normal.        Behavior: Behavior normal.      UC Treatments / Results  Labs (all labs ordered are listed, but only abnormal results are displayed) Labs Reviewed - No data to display  EKG   Radiology No results found.  Procedures Procedures (including critical care time)  Medications Ordered in UC Medications - No data to display  Initial Impression / Assessment and Plan / UC Course  I have reviewed the triage vital signs and the nursing notes.  Pertinent labs & imaging results that were available during my care of the patient were reviewed by me and considered in my medical decision making (see chart for details).   Toni Foster is a 32 y.o. female presenting with sinusitis. Patient is afebrile without recent antipyretics, satting well on room air. Overall is ill appearing though non-toxic, well hydrated, without respiratory distress. Pulmonary exam is unremarkable.  Lungs CTAB without wheezing, rhonchi, rales.  Patient appears very uncomfortable.  Duration of symptoms just over a week is suggestive of viral etiology.  However given acute worsening of symptoms in the last day, suspicious of possible developing bacterial involvement.  Will treat with antibiotic therapy using Augmentin.  Given the severity of her symptoms, after shared decision-making, agreed to prescribe a course of prednisone to speed the resolution of her sinus pain.  Counseled patient on potential for adverse effects with medications prescribed/recommended today, ER and return-to-clinic precautions discussed, patient verbalized understanding and agreement with care plan.   Final Clinical Impressions(s) / UC Diagnoses   Final diagnoses:  None   Discharge Instructions   None    ED Prescriptions   None    PDMP not reviewed this encounter.   Charma Igo, [place] 11/07/22 1714

## 2022-11-26 ENCOUNTER — Other Ambulatory Visit: Payer: Self-pay

## 2022-11-26 ENCOUNTER — Emergency Department (HOSPITAL_BASED_OUTPATIENT_CLINIC_OR_DEPARTMENT_OTHER)
Admission: EM | Admit: 2022-11-26 | Discharge: 2022-11-27 | Disposition: A | Discharge status: Home / Self Care | Payer: BC Managed Care – PPO | Attending: Emergency Medicine | Admitting: Emergency Medicine

## 2022-11-26 DIAGNOSIS — K529 Noninfective gastroenteritis and colitis, unspecified: Secondary | ICD-10-CM | POA: Diagnosis not present

## 2022-11-26 DIAGNOSIS — R112 Nausea with vomiting, unspecified: Secondary | ICD-10-CM | POA: Diagnosis present

## 2022-11-26 LAB — COMPREHENSIVE METABOLIC PANEL
ALT: 17 U/L (ref 0–44)
AST: 22 U/L (ref 15–41)
Albumin: 3.8 g/dL (ref 3.5–5.0)
Alkaline Phosphatase: 62 U/L (ref 38–126)
Anion gap: 10 (ref 5–15)
BUN: 10 mg/dL (ref 6–20)
CO2: 23 mmol/L (ref 22–32)
Calcium: 8.5 mg/dL — ABNORMAL LOW (ref 8.9–10.3)
Chloride: 105 mmol/L (ref 98–111)
Creatinine, Ser: 0.78 mg/dL (ref 0.44–1.00)
GFR, Estimated: >60 mL/min (ref >60)
Glucose, Bld: 108 mg/dL — ABNORMAL HIGH (ref 70–99)
Potassium: 3.5 mmol/L (ref 3.5–5.1)
Sodium: 138 mmol/L (ref 135–145)
Total Bilirubin: 0.5 mg/dL (ref 0.3–1.2)
Total Protein: 7.4 g/dL (ref 6.5–8.1)

## 2022-11-26 LAB — URINALYSIS, ROUTINE W REFLEX MICROSCOPIC
Glucose, UA: NEGATIVE mg/dL
Ketones, ur: 15 mg/dL — AB
Leukocytes,Ua: NEGATIVE
Nitrite: NEGATIVE
Protein, ur: 30 mg/dL — AB
Specific Gravity, Urine: 1.025 (ref 1.005–1.030)
pH: 6 (ref 5.0–8.0)

## 2022-11-26 LAB — URINALYSIS, MICROSCOPIC (REFLEX)

## 2022-11-26 LAB — CBC
HCT: 45.1 % (ref 36.0–46.0)
Hemoglobin: 14.7 g/dL (ref 12.0–15.0)
MCH: 29.9 pg (ref 26.0–34.0)
MCHC: 32.6 g/dL (ref 30.0–36.0)
MCV: 91.9 fL (ref 80.0–100.0)
Platelets: 281 10*3/uL (ref 150–400)
RBC: 4.91 MIL/uL (ref 3.87–5.11)
RDW: 13.4 % (ref 11.5–15.5)
WBC: 6 10*3/uL (ref 4.0–10.5)
nRBC: 0 % (ref 0.0–0.2)

## 2022-11-26 LAB — LIPASE, BLOOD: Lipase: 23 U/L (ref 11–51)

## 2022-11-26 LAB — PREGNANCY, URINE: Preg Test, Ur: NEGATIVE

## 2022-11-26 MED ORDER — SODIUM CHLORIDE 0.9 % IV BOLUS
1000.0000 mL | Freq: Once | INTRAVENOUS | Status: AC
  Administered 2022-11-26: 1000 mL via INTRAVENOUS

## 2022-11-26 MED ORDER — ONDANSETRON HCL 4 MG/2ML IJ SOLN
4.0000 mg | Freq: Once | INTRAMUSCULAR | Status: AC
  Administered 2022-11-26: 4 mg via INTRAVENOUS
  Filled 2022-11-26: qty 2

## 2022-11-26 MED ORDER — METOCLOPRAMIDE HCL 5 MG/ML IJ SOLN: 10.0000 mg | Freq: Once | INTRAMUSCULAR | Status: DC | PRN

## 2022-11-26 NOTE — ED Provider Notes (Incomplete)
[facility] Provider Note: Toni Dell, MD, FACEP  CSN: [id number] MRN: [id number] ARRIVAL: 11/26/22 at 1956 ROOM: [facility]   CHIEF COMPLAINT  Vomiting   HISTORY OF PRESENT ILLNESS  11/26/22 11:01 PM Toni Foster is a 32 y.o. female who developed nausea, vomiting and diarrhea yesterday evening which persist today.  She is having generalized abdominal pain which she rates as a 6 out of 10.    Past Medical History:  Diagnosis Date  . Medical history non-contributory     Past Surgical History:  Procedure Laterality Date  . ANTERIOR CRUCIATE LIGAMENT REPAIR    . TONSILLECTOMY    . WISDOM TOOTH EXTRACTION      No family history on file.  Social History   Tobacco Use  . Smoking status: Never  . Smokeless tobacco: Never  Vaping Use  . Vaping Use: Never used  Substance Use Topics  . Alcohol use: No  . Drug use: Never    Prior to Admission medications   Medication Sig Start Date End Date Taking? Authorizing Provider  acetaminophen (TYLENOL) 500 MG tablet Take 2 tablets (1,000 mg total) by mouth every 6 (six) hours as needed (for pain scale < 4). 10/25/20   Gustavo Lah, CNM  amoxicillin-clavulanate (AUGMENTIN) 875-125 MG tablet Take 1 tablet by mouth every 12 (twelve) hours. 11/07/22   Immordino, Jeannett Senior, FNP  amphetamine-dextroamphetamine (ADDERALL XR) 30 MG 24 hr capsule Take by mouth. 01/23/21 12/18/21  [provider]  cloNIDine (CATAPRES) 0.1 MG tablet Take 0.1 mg by mouth 2 (two) times daily.    [provider]  desvenlafaxine (PRISTIQ) 100 MG 24 hr tablet Take 100 mg by mouth daily.    [provider]  etonogestrel (NEXPLANON) 68 MG IMPL implant Inject into the skin.    [provider]  fluticasone (FLONASE) 50 MCG/ACT nasal spray Place 2 sprays into both nostrils daily. 11/22/21   Domenick Gong, MD  ibuprofen (ADVIL) 600 MG tablet Take 1 tablet (600 mg total) by mouth every 6 (six) hours as needed. 11/22/21    Domenick Gong, MD  Prenatal Vit-Fe Fumarate-FA (MULTIVITAMIN-PRENATAL) 27-0.8 MG TABS tablet Take 1 tablet by mouth daily at 12 noon.    [provider]  sertraline (ZOLOFT) 50 MG tablet Take 75 mg by mouth daily. 11/06/21   [provider]  tiZANidine (ZANAFLEX) 4 MG tablet Take by mouth. 01/17/22   [provider]    Allergies Wheat   REVIEW OF SYSTEMS  Negative except as noted here or in the History of Present Illness.   PHYSICAL EXAMINATION  Initial Vital Signs Blood pressure (!) 148/95, pulse (!) 108, temperature 98.8 F (37.1 C), temperature source Oral, resp. rate 18, height 5\' 2"  (1.575 m), weight 77.1 kg, last menstrual period 11/26/2022, SpO2 99 %, unknown if currently breastfeeding.  Examination General: Well-developed, well-nourished female in no acute distress; appearance consistent with age of record HENT: normocephalic; atraumatic Eyes: pupils equal, round and reactive to light; extraocular muscles intact Neck: supple Heart: regular rate and rhythm; no murmurs, rubs or gallops Lungs: clear to auscultation bilaterally Abdomen: soft; nondistended; nontender; no masses or hepatosplenomegaly; bowel sounds present Extremities: No deformity; full range of motion; pulses normal Neurologic: Awake, alert and oriented; motor function intact in all extremities and symmetric; no facial droop Skin: Warm and dry Psychiatric: Normal mood and affect   RESULTS  Summary of this visit's results, reviewed and interpreted by myself:   EKG Interpretation  Date/Time:  Ventricular Rate:    PR Interval:    QRS Duration:   QT Interval:    QTC Calculation:   R Axis:     Text Interpretation:         Laboratory Studies: Results for orders placed or performed during the hospital encounter of 11/26/22 (from the past 24 hour(s))  Lipase, blood     Status: None   Collection Time: 11/26/22  8:34 PM  Result Value Ref Range   Lipase 23 11 - 51 U/L   Comprehensive metabolic panel     Status: Abnormal   Collection Time: 11/26/22  8:34 PM  Result Value Ref Range   Sodium 138 135 - 145 mmol/L   Potassium 3.5 3.5 - 5.1 mmol/L   Chloride 105 98 - 111 mmol/L   CO2 23 22 - 32 mmol/L   Glucose, Bld 108 (H) 70 - 99 mg/dL   BUN 10 6 - 20 mg/dL   Creatinine, Ser 9.60 0.44 - 1.00 mg/dL   Calcium 8.5 (L) 8.9 - 10.3 mg/dL   Total Protein 7.4 6.5 - 8.1 g/dL   Albumin 3.8 3.5 - 5.0 g/dL   AST 22 15 - 41 U/L   ALT 17 0 - 44 U/L   Alkaline Phosphatase 62 38 - 126 U/L   Total Bilirubin 0.5 0.3 - 1.2 mg/dL   GFR, Estimated >45 >40 mL/min   Anion gap 10 5 - 15  CBC     Status: None   Collection Time: 11/26/22  8:34 PM  Result Value Ref Range   WBC 6.0 4.0 - 10.5 K/uL   RBC 4.91 3.87 - 5.11 MIL/uL   Hemoglobin 14.7 12.0 - 15.0 g/dL   HCT 98.1 19.1 - 47.8 %   MCV 91.9 80.0 - 100.0 fL   MCH 29.9 26.0 - 34.0 pg   MCHC 32.6 30.0 - 36.0 g/dL   RDW 29.5 62.1 - 30.8 %   Platelets 281 150 - 400 K/uL   nRBC 0.0 0.0 - 0.2 %  Urinalysis, Routine w reflex microscopic -Urine, Clean Catch     Status: Abnormal   Collection Time: 11/26/22  8:34 PM  Result Value Ref Range   Color, Urine YELLOW YELLOW   APPearance CLEAR CLEAR   Specific Gravity, Urine 1.025 1.005 - 1.030   pH 6.0 5.0 - 8.0   Glucose, UA NEGATIVE NEGATIVE mg/dL   Hgb urine dipstick TRACE (A) NEGATIVE   Bilirubin Urine SMALL (A) NEGATIVE   Ketones, ur 15 (A) NEGATIVE mg/dL   Protein, ur 30 (A) NEGATIVE mg/dL   Nitrite NEGATIVE NEGATIVE   Leukocytes,Ua NEGATIVE NEGATIVE  Pregnancy, urine     Status: None   Collection Time: 11/26/22  8:34 PM  Result Value Ref Range   Preg Test, Ur NEGATIVE NEGATIVE  Urinalysis, Microscopic (reflex)     Status: Abnormal   Collection Time: 11/26/22  8:34 PM  Result Value Ref Range   RBC / HPF 0-5 0 - 5 RBC/hpf   WBC, UA 0-5 0 - 5 WBC/hpf   Bacteria, UA RARE (A) NONE SEEN   Squamous Epithelial / HPF 0-5 0 - 5 /HPF   Imaging Studies: No results  found.  ED COURSE and MDM  Nursing notes, initial and subsequent vitals signs, including pulse oximetry, reviewed and interpreted by myself.  Vitals:   11/26/22 2031 11/26/22 2031  BP:  (!) 148/95  Pulse:  (!) 108  Resp:  18  Temp:  98.8 F (37.1 C)  TempSrc:  Oral  SpO2:  99%  Weight: 77.1 kg   Height: 5\' 2"  (1.575 m)    Medications - No data to display    PROCEDURES  Procedures   ED DIAGNOSES  No diagnosis found.

## 2022-11-26 NOTE — ED Provider Notes (Signed)
[facility] Provider Note: Toni Dell, MD, FACEP  CSN: [id number] MRN: [id number] ARRIVAL: 11/26/22 at 1956 ROOM: MH11/[facility]11   CHIEF COMPLAINT  Vomiting   HISTORY OF PRESENT ILLNESS  11/26/22 11:09 PM Toni Foster is a 32 y.o. female who developed nausea, vomiting and diarrhea yesterday evening about 7 PM.  It has persisted since.  She has been vomiting about every hour.  She has not been able to keep any food on her stomach.  She describes the diarrhea is profuse and watery.  She is having abdominal pain, primarily in the epigastrium, which she rates as a 6 out of 10.  This came on after the vomiting began.   Past Medical History:  Diagnosis Date   Medical history non-contributory     Past Surgical History:  Procedure Laterality Date   ANTERIOR CRUCIATE LIGAMENT REPAIR     TONSILLECTOMY     WISDOM TOOTH EXTRACTION      No family history on file.  Social History   Tobacco Use   Smoking status: Never   Smokeless tobacco: Never  Vaping Use   Vaping Use: Never used  Substance Use Topics   Alcohol use: No   Drug use: Never    Prior to Admission medications   Medication Sig Start Date End Date Taking? Authorizing Provider  ondansetron (ZOFRAN-ODT) 8 MG disintegrating tablet Take 1 tablet (8 mg total) by mouth every 8 (eight) hours as needed for nausea or vomiting. 11/27/22  Yes Gayl Ivanoff, MD  acetaminophen (TYLENOL) 500 MG tablet Take 2 tablets (1,000 mg total) by mouth every 6 (six) hours as needed (for pain scale < 4). 10/25/20   Gustavo Lah, CNM  amphetamine-dextroamphetamine (ADDERALL XR) 30 MG 24 hr capsule Take by mouth. 01/23/21 12/18/21  [provider]  cloNIDine (CATAPRES) 0.1 MG tablet Take 0.1 mg by mouth 2 (two) times daily.    [provider]  desvenlafaxine (PRISTIQ) 100 MG 24 hr tablet Take 100 mg by mouth daily.    [provider]  etonogestrel (NEXPLANON) 68 MG IMPL implant Inject into the skin.     [provider]  fluticasone (FLONASE) 50 MCG/ACT nasal spray Place 2 sprays into both nostrils daily. 11/22/21   Domenick Gong, MD  ibuprofen (ADVIL) 600 MG tablet Take 1 tablet (600 mg total) by mouth every 6 (six) hours as needed. 11/22/21   Domenick Gong, MD  Prenatal Vit-Fe Fumarate-FA (MULTIVITAMIN-PRENATAL) 27-0.8 MG TABS tablet Take 1 tablet by mouth daily at 12 noon.    [provider]  sertraline (ZOLOFT) 50 MG tablet Take 75 mg by mouth daily. 11/06/21   [provider]  tiZANidine (ZANAFLEX) 4 MG tablet Take by mouth. 01/17/22   [provider]    Allergies Wheat   REVIEW OF SYSTEMS  Negative except as noted here or in the History of Present Illness.   PHYSICAL EXAMINATION  Initial Vital Signs Blood pressure (!) 148/95, pulse (!) 108, temperature 98.8 F (37.1 C), temperature source Oral, resp. rate 18, height 5\' 2"  (1.575 m), weight 77.1 kg, last menstrual period 11/26/2022, SpO2 99 %, unknown if currently breastfeeding.  Examination General: Well-developed, well-nourished female in no acute distress; appearance consistent with age of record HENT: normocephalic; atraumatic Eyes: Normal appearance Neck: supple Heart: regular rate and rhythm tachycardia Lungs: clear to auscultation bilaterally Abdomen: soft; nondistended; epigastric tenderness; bowel sounds present Extremities: No deformity; full range of motion; pulses normal Neurologic: Awake, alert and oriented; motor function intact  in all extremities and symmetric; no facial droop Skin: Warm and dry Psychiatric: Normal mood and affect   RESULTS  Summary of this visit's results, reviewed and interpreted by myself:   EKG Interpretation  Date/Time:    Ventricular Rate:    PR Interval:    QRS Duration:   QT Interval:    QTC Calculation:   R Axis:     Text Interpretation:         Laboratory Studies: Results for orders placed or performed during the hospital  encounter of 11/26/22 (from the past 24 hour(s))  Lipase, blood     Status: None   Collection Time: 11/26/22  8:34 PM  Result Value Ref Range   Lipase 23 11 - 51 U/L  Comprehensive metabolic panel     Status: Abnormal   Collection Time: 11/26/22  8:34 PM  Result Value Ref Range   Sodium 138 135 - 145 mmol/L   Potassium 3.5 3.5 - 5.1 mmol/L   Chloride 105 98 - 111 mmol/L   CO2 23 22 - 32 mmol/L   Glucose, Bld 108 (H) 70 - 99 mg/dL   BUN 10 6 - 20 mg/dL   Creatinine, Ser 0.45 0.44 - 1.00 mg/dL   Calcium 8.5 (L) 8.9 - 10.3 mg/dL   Total Protein 7.4 6.5 - 8.1 g/dL   Albumin 3.8 3.5 - 5.0 g/dL   AST 22 15 - 41 U/L   ALT 17 0 - 44 U/L   Alkaline Phosphatase 62 38 - 126 U/L   Total Bilirubin 0.5 0.3 - 1.2 mg/dL   GFR, Estimated >40 >98 mL/min   Anion gap 10 5 - 15  CBC     Status: None   Collection Time: 11/26/22  8:34 PM  Result Value Ref Range   WBC 6.0 4.0 - 10.5 K/uL   RBC 4.91 3.87 - 5.11 MIL/uL   Hemoglobin 14.7 12.0 - 15.0 g/dL   HCT 11.9 14.7 - 82.9 %   MCV 91.9 80.0 - 100.0 fL   MCH 29.9 26.0 - 34.0 pg   MCHC 32.6 30.0 - 36.0 g/dL   RDW 56.2 13.0 - 86.5 %   Platelets 281 150 - 400 K/uL   nRBC 0.0 0.0 - 0.2 %  Urinalysis, Routine w reflex microscopic -Urine, Clean Catch     Status: Abnormal   Collection Time: 11/26/22  8:34 PM  Result Value Ref Range   Color, Urine YELLOW YELLOW   APPearance CLEAR CLEAR   Specific Gravity, Urine 1.025 1.005 - 1.030   pH 6.0 5.0 - 8.0   Glucose, UA NEGATIVE NEGATIVE mg/dL   Hgb urine dipstick TRACE (A) NEGATIVE   Bilirubin Urine SMALL (A) NEGATIVE   Ketones, ur 15 (A) NEGATIVE mg/dL   Protein, ur 30 (A) NEGATIVE mg/dL   Nitrite NEGATIVE NEGATIVE   Leukocytes,Ua NEGATIVE NEGATIVE  Pregnancy, urine     Status: None   Collection Time: 11/26/22  8:34 PM  Result Value Ref Range   Preg Test, Ur NEGATIVE NEGATIVE  Urinalysis, Microscopic (reflex)     Status: Abnormal   Collection Time: 11/26/22  8:34 PM  Result Value Ref Range   RBC  / HPF 0-5 0 - 5 RBC/hpf   WBC, UA 0-5 0 - 5 WBC/hpf   Bacteria, UA RARE (A) NONE SEEN   Squamous Epithelial / HPF 0-5 0 - 5 /HPF   Imaging Studies: No results found.  ED COURSE and MDM  Nursing notes, initial and subsequent vitals  signs, including pulse oximetry, reviewed and interpreted by myself.  Vitals:   11/26/22 2031 11/26/22 2031  BP:  (!) 148/95  Pulse:  (!) 108  Resp:  18  Temp:  98.8 F (37.1 C)  TempSrc:  Oral  SpO2:  99%  Weight: 77.1 kg   Height: 5\' 2"  (1.575 m)    Medications  metoCLOPramide (REGLAN) injection 10 mg (has no administration in time range)  loperamide (IMODIUM) capsule 4 mg (has no administration in time range)  sodium chloride 0.9 % bolus 1,000 mL (1,000 mLs Intravenous New Bag/Given 11/26/22 2325)  ondansetron (ZOFRAN) injection 4 mg (4 mg Intravenous Given 11/26/22 2325)   12:44 AM Nausea controlled with IV medications.  Patient able to drink fluids without vomiting.  Presentation consistent with a viral gastroenteritis.   PROCEDURES  Procedures   ED DIAGNOSES     ICD-10-CM   1. Gastroenteritis  K52.9          Deshauna Cayson, MD 11/27/22 [phone]

## 2022-11-26 NOTE — ED Triage Notes (Signed)
Patient started with nausea, vomiting and diarrhea last night, continuing on into today.  She has not been able keep anything down today.  Patient with generalized abdominal pain.

## 2022-11-27 MED ORDER — LOPERAMIDE HCL 2 MG PO CAPS
4.0000 mg | ORAL_CAPSULE | Freq: Once | ORAL | Status: AC
  Administered 2022-11-27: 4 mg via ORAL
  Filled 2022-11-27: qty 2

## 2022-11-27 MED ORDER — ONDANSETRON 8 MG PO TBDP: 8.0000 mg | ORAL_TABLET | Freq: Three times a day (TID) | ORAL | 0 refills | Status: DC | PRN

## 2022-11-27 NOTE — ED Notes (Signed)
Ginger ale offered to pt, no c/o of nausea at this time

## 2022-11-27 NOTE — ED Notes (Signed)
Pt tolerated PO fluids well, no c/o pain

## 2023-07-16 NOTE — Progress Notes (Unsigned)
    NURSE VISIT NOTE  Subjective:    Patient ID: LINET IWEN, female    DOB: [birth year], 32 y.o.   MRN: [id number]  HPI  Patient is a 32 y.o. G72P1001 female who presents for evaluation of amenorrhea. She believes she could be pregnant. {pregnancy desire:14500::"Pregnancy is desired."} Sexual Activity: {sexual partners:705}. Current symptoms also include: {preg sx:18128}. Last period was {norm/abn:16337}.    Objective:    There were no vitals taken for this visit.  Lab Review  No results found for any visits on 07/17/23.  Assessment:   No diagnosis found.  Plan:   {AOB + PREGNANCY PLAN:28596:p}  {AOB - PREGNANCY PLAN:28597:p}  Gracelin Weisberg DO

## 2023-07-17 ENCOUNTER — Encounter: Payer: Self-pay | Admitting: Obstetrics

## 2023-07-17 ENCOUNTER — Ambulatory Visit: Payer: Self-pay | Admitting: Obstetrics

## 2023-07-17 VITALS — BP 138/87 | HR 105 | Wt 167.0 lb

## 2023-07-17 DIAGNOSIS — N912 Amenorrhea, unspecified: Secondary | ICD-10-CM

## 2023-07-17 DIAGNOSIS — N926 Irregular menstruation, unspecified: Secondary | ICD-10-CM

## 2023-07-17 DIAGNOSIS — Z3201 Encounter for pregnancy test, result positive: Secondary | ICD-10-CM | POA: Diagnosis not present

## 2023-07-17 DIAGNOSIS — O219 Vomiting of pregnancy, unspecified: Secondary | ICD-10-CM

## 2023-07-17 LAB — POCT URINE PREGNANCY: Preg Test, Ur: POSITIVE — AB

## 2023-07-17 MED ORDER — PRENATAL 27-0.8 MG PO TABS
1.0000 | ORAL_TABLET | Freq: Every day | ORAL | 3 refills | Status: AC
Start: 2023-07-17 — End: ?

## 2023-07-17 MED ORDER — DOXYLAMINE SUCCINATE (SLEEP) 25 MG PO TABS
25.0000 mg | ORAL_TABLET | Freq: Four times a day (QID) | ORAL | 2 refills | Status: DC | PRN
Start: 2023-07-17 — End: 2023-08-26

## 2023-07-17 NOTE — Patient Instructions (Signed)

## 2023-07-20 ENCOUNTER — Encounter: Payer: Self-pay | Admitting: Obstetrics

## 2023-08-01 ENCOUNTER — Telehealth: Payer: BC Managed Care – PPO

## 2023-08-02 ENCOUNTER — Telehealth: Payer: BC Managed Care – PPO

## 2023-08-15 ENCOUNTER — Telehealth: Payer: Self-pay

## 2023-08-15 NOTE — Telephone Encounter (Signed)
Patient is calling with concerns of headaches since Sunday. She has a history of chronic migraines and is worried that it may lead to a migraine. Headaches are constant, they have been shooting headaches but mostly dull. She has been taking 500 mg of Tylenol 1-2 tablets. She is ~ [redacted] weeks pregnant. She has not been seen for NOB intake because she missed her appointment and had to reschedule. I advised patient of headache protocols and she said she has been following protocols and it still has not resolved her headaches. She would like to know what else she can do or take to get rid of her headaches.

## 2023-08-15 NOTE — Telephone Encounter (Signed)
Patient called back to report blood pressure 145/96. I spoke with Dr. Lonny Prude and she advised that patient follow-up with PCP about her migraines until she establishes care with [place]. She also advised that it was safe for her to take Excedrin Migraine as directed on the back of bottle to see if that would help. Patient verbalized understanding.

## 2023-08-26 ENCOUNTER — Telehealth: Payer: BC Managed Care – PPO

## 2023-08-26 DIAGNOSIS — Z3689 Encounter for other specified antenatal screening: Secondary | ICD-10-CM

## 2023-08-26 DIAGNOSIS — Z3480 Encounter for supervision of other normal pregnancy, unspecified trimester: Secondary | ICD-10-CM | POA: Insufficient documentation

## 2023-08-26 NOTE — Progress Notes (Signed)
New OB Intake  I connected with  Toni Foster on 08/26/23 at  8:15 AM EST by MyChart Video Visit and verified that I am speaking with the correct person using two identifiers. Nurse is located at [facility] and pt is located at her vehicle.  I discussed the limitations, risks, security and privacy concerns of performing an evaluation and management service by telephone and the availability of in person appointments. I also discussed with the patient that there may be a patient responsible charge related to this service. The patient expressed understanding and agreed to proceed.  I explained I am completing New OB Intake today. We discussed her EDD of 03/05/2024 that is based on LMP of 05/29/2024. Pt is G2/P1. I reviewed her allergies, medications, Medical/Surgical/OB history, and appropriate screenings. There are cats in the home in the home? no . Based on history, this is a/an pregnancy uncomplicated . Her obstetrical history is significant for  none .  Patient Active Problem List   Diagnosis Date Noted   Supervision of other normal pregnancy, antepartum 08/26/2023   Post-term pregnancy, 40-42 weeks of gestation 10/23/2020   Uterine contractions during pregnancy 10/19/2020   ABNORMALITY OF GAIT 06/06/2010   PATELLAR DISLOCATION, RIGHT 06/06/2010    Concerns addressed today  Delivery Plans:  Plans to deliver at [facility]  Anatomy US Explained first scheduled [place] will be around 19 weeks, will place order today for dating scan and provide patient number to contact centralized scheduling.   Labs Discussed Toni Foster genetic screening with patient. Patient plans on getting genetic testing to be drawn at new OB visit. Discussed possible labs to be drawn at new OB appointment.  COVID Vaccine Patient has not had COVID vaccine.   Social Determinants of Health Food Insecurity: denies food insecurity WIC Referral: Patient is not interested in referral to [facility].   Transportation: Patient denies transportation needs. Childcare: Discussed no children allowed at ultrasound appointments.   First visit review I reviewed new OB appt with pt. I explained she will have ob bloodwork and pap smear/pelvic exam if indicated. Explained pt will be seen by Toni Foster 09/05/2023 at first visit; encounter routed to appropriate provider.   Toni Foster, CMA 08/26/2023  8:39 AM

## 2023-08-26 NOTE — Patient Instructions (Signed)
First Trimester of Pregnancy  The first trimester of pregnancy starts on the first day of your last monthly period until the end of week 13. This is months 1 through 3 of pregnancy. A week after a sperm fertilizes an egg, the egg will implant into the wall of the uterus and begin to develop into a baby. Body changes during your first trimester Your body goes through many changes during pregnancy. The changes usually return to normal after your baby is born. Physical changes Your breasts may grow larger and may hurt. The area around your nipples may get darker. Your periods will stop. Your hair and nails may grow faster. You may pee more often. Health changes You may tire easily. Your gums may bleed and may be sensitive when you brush and floss. You may not feel hungry. You may have heartburn. You may throw up or feel like you may throw up. You may want to eat some foods, but not others. You may have headaches. You may have trouble pooping (constipation). Other changes Your emotions may change from day to day. You may have more dreams. Follow these instructions at home: Medicines Talk to your health care provider if you're taking medicines. Ask if the medicines are safe to take during pregnancy. Your provider may change the medicines that you take. Do not take any medicines unless told to by your provider. Take a prenatal vitamin that has at least 600 micrograms (mcg) of folic acid. Do not use herbal medicines, illegal substances, or medicines that are not approved by your provider. Eating and drinking While you're pregnant your body needs extra food for your growing baby. Talk with your provider about what to eat while pregnant. Activity Most women are able to exercise during pregnancy. Exercises may need to change as your pregnancy goes on. Talk to your provider about your activities and exercise routines. Relieving pain and discomfort Wear a good, supportive bra if your breasts  hurt. Rest with your legs raised if you have leg cramps or low back pain. Safety Wear your seatbelt at all times when you're in a car. Talk to your provider if someone hits you, hurts you, or yells at you. Talk with your provider if you're feeling sad or have thoughts of hurting yourself. Lifestyle Certain things can be harmful while you're pregnant. Follow these rules: Do not use hot tubs, steam rooms, or saunas. Do not douche. Do not use tampons or scented pads. Do not drink alcohol,smoke, vape, or use products with nicotine or tobacco in them. If you need help quitting, talk with your provider. Avoid cat litter boxes and soil used by cats. These things carry germs that can cause harm to your pregnancy and your baby. General instructions Keep all follow-up visits. It helps you and your unborn baby stay as healthy as possible. Write down your questions. Take them to your visits. Your provider will: Talk with you about your overall health. Give you advice or refer you to specialists who can help with different needs, including: Prenatal education classes. Mental health and counseling. Foods and healthy eating. Ask for help if you need help with food. Call your dentist and ask to be seen. Brush your teeth with a soft toothbrush. Floss gently. Where to find more information American Pregnancy Association: americanpregnancy.org [employer]: acog.org Office on [employer] Health: [employer] Contact a health care provider if: You feel dizzy, faint, or have a fever. You vomit or have watery poop (diarrhea) for 2  days or more. You have abnormal discharge or bleeding from your vagina. You have pain when you pee or your pee smells bad. You have cramps, pain, or pressure in your belly area. Get help right away if: You have trouble breathing or chest pain. You have any kind of injury, such as from a fall or a car crash. These symptoms may be an  emergency. Get help right away. Call 911. Do not wait to see if the symptoms will go away. Do not drive yourself to the hospital. This information is not intended to replace advice given to you by your health care provider. Make sure you discuss any questions you have with your health care provider. Document Revised: 04/18/2023 Document Reviewed: 11/16/2022 Elsevier Patient Education  2024 Elsevier Inc.   Common Medications Safe in Pregnancy  Acne:      Constipation:  Benzoyl Peroxide     Colace  Clindamycin      Dulcolax Suppository  Topica Erythromycin     Fibercon  Salicylic Acid      Metamucil         Miralax AVOID:        Senakot   Accutane    Cough:  Retin-A       Cough Drops  Tetracycline      Phenergan w/ Codeine if Rx  Minocycline      Robitussin (Plain & DM)  Antibiotics:     Crabs/Lice:  Ceclor       RID  Cephalosporins    AVOID:  E-Mycins      Kwell  Keflex  Macrobid/Macrodantin   Diarrhea:  Penicillin      Kao-Pectate  Zithromax      Imodium AD         PUSH FLUIDS AVOID:       Cipro     Fever:  Tetracycline      Tylenol (Regular or Extra  Minocycline       Strength)  Levaquin      Extra Strength-Do not          Exceed 8 tabs/24 hrs Caffeine:        200mg /day (equiv. To 1 cup of coffee or  approx. 3 12 oz sodas)         Gas: Cold/Hayfever:       Gas-X  Benadryl      Mylicon  Claritin       Phazyme  **Claritin-D        Chlor-Trimeton    Headaches:  Dimetapp      ASA-Free Excedrin  Drixoral-Non-Drowsy     Cold Compress  Mucinex (Guaifenasin)     Tylenol (Regular or Extra  Sudafed/Sudafed-12 Hour     Strength)  **Sudafed PE Pseudoephedrine   Tylenol Cold & Sinus     Vicks Vapor Rub  Zyrtec  **AVOID if Problems With Blood Pressure         Heartburn: Avoid lying down for at least 1 hour after meals  Aciphex      Maalox     Rash:  Milk of Magnesia     Benadryl    Mylanta       1% Hydrocortisone Cream  Pepcid  Pepcid Complete   Sleep  Aids:  Prevacid      Ambien   Prilosec       Benadryl  Rolaids       Chamomile Tea  Tums (Limit 4/day)     Unisom  Tylenol PM         Warm milk-add vanilla or  Hemorrhoids:       Sugar for taste  Anusol/Anusol H.C.  (RX: Analapram 2.5%)  Sugar Substitutes:  Hydrocortisone OTC     Ok in moderation  Preparation H      Tucks        Vaseline lotion applied to tissue with wiping    Herpes:     Throat:  Acyclovir      Oragel  Famvir  Valtrex     Vaccines:         Flu Shot Leg Cramps:       *Gardasil  Benadryl      Hepatitis A         Hepatitis B Nasal Spray:       Pneumovax  Saline Nasal Spray     Polio Booster         Tetanus Nausea:       Tuberculosis test or PPD  Vitamin B6 25 mg TID   AVOID:    Dramamine      *Gardasil  Emetrol       Live Poliovirus  Ginger Root 250 mg QID    MMR (measles, mumps &  High Complex Carbs @ Bedtime    rebella)  Sea Bands-Accupressure    Varicella (Chickenpox)  Unisom 1/2 tab TID     *No known complications           If received before Pain:         Known pregnancy;   Darvocet       Resume series after  Lortab        Delivery  Percocet    Yeast:   Tramadol      Femstat  Tylenol 3      Gyne-lotrimin  Ultram       Monistat  Vicodin           MISC:         All Sunscreens           Hair Coloring/highlights          Insect Repellant's          (Including DEET)         Mystic Tans   Commonly Asked Questions During Pregnancy   Cats: A parasite can be excreted in cat feces.  To avoid exposure you need to have another person empty the little box.  If you must empty the litter box you will need to wear gloves.  Wash your hands after handling your cat.  This parasite can also be found in raw or undercooked meat so this should also be avoided.  Colds, Sore Throats, Flu: Please check your medication sheet to see what you can take for symptoms.  If your symptoms are unrelieved by these medications please call the office.  Dental Work: Most  any dental work Agricultural consultant recommends is permitted.  X-rays should only be taken during the first trimester if absolutely necessary.  Your abdomen should be shielded with a lead apron during all x-rays.  Please notify your provider prior to receiving any x-rays.  Novocaine is fine; gas is not recommended.  If your dentist requires a note from [place] prior to dental work please call the office and we will provide one for you.  Exercise: Exercise is an important part of staying healthy during your pregnancy.  You may continue most exercises you were accustomed to prior to pregnancy.  Later in your pregnancy you will most likely notice you have difficulty with activities requiring balance like riding a bicycle.  It is important that you listen to your body and avoid activities that put you at a higher risk of falling.  Adequate rest and staying well hydrated are a must!  If you have questions about the safety of specific activities ask your provider.    Exposure to Children with illness: Try to avoid obvious exposure; report any symptoms to [place] when noted,  If you have chicken pos, red measles or mumps, you should be immune to these diseases.   Please do not take any vaccines while pregnant unless you have checked with your OB provider.  Fetal Movement: After 28 weeks we recommend you do "kick counts" twice daily.  Lie or sit down in a calm quiet environment and count your baby movements "kicks".  You should feel your baby at least 10 times per hour.  If you have not felt 10 kicks within the first hour get up, walk around and have something sweet to eat or drink then repeat for an additional hour.  If count remains less than 10 per hour notify your provider.  Fumigating: Follow your pest control agent's advice as to how long to stay out of your home.  Ventilate the area well before re-entering.  Hemorrhoids:   Most over-the-counter preparations can be used during pregnancy.  Check your medication to see what is  safe to use.  It is important to use a stool softener or fiber in your diet and to drink lots of liquids.  If hemorrhoids seem to be getting worse please call the office.   Hot Tubs:  Hot tubs Jacuzzis and saunas are not recommended while pregnant.  These increase your internal body temperature and should be avoided.  Intercourse:  Sexual intercourse is safe during pregnancy as long as you are comfortable, unless otherwise advised by your provider.  Spotting may occur after intercourse; report any bright red bleeding that is heavier than spotting.  Labor:  If you know that you are in labor, please go to the hospital.  If you are unsure, please call the office and let us help you decide what to do.  Lifting, straining, etc:  If your job requires heavy lifting or straining please check with your provider for any limitations.  Generally, you should not lift items heavier than that you can lift simply with your hands and arms (no back muscles)  Painting:  Paint fumes do not harm your pregnancy, but may make you ill and should be avoided if possible.  Latex or water based paints have less odor than oils.  Use adequate ventilation while painting.  Permanents & Hair Color:  Chemicals in hair dyes are not recommended as they cause increase hair dryness which can increase hair loss during pregnancy.  " Highlighting" and permanents are allowed.  Dye may be absorbed differently and permanents may not hold as well during pregnancy.  Sunbathing:  Use a sunscreen, as skin burns easily during pregnancy.  Drink plenty of fluids; avoid over heating.  Tanning Beds:  Because their possible side effects are still unknown, tanning beds are not recommended.  Ultrasound Scans:  Routine ultrasounds are performed at approximately 20 weeks.  You will be able to see your baby's general anatomy an if you would like to know the gender this can usually be determined as well.  If it is questionable when you conceived you may  also  receive an ultrasound early in your pregnancy for dating purposes.  Otherwise ultrasound exams are not routinely performed unless there is a medical necessity.  Although you can request a scan we ask that you pay for it when conducted because insurance does not cover " patient request" scans.  Work: If your pregnancy proceeds without complications you may work until your due date, unless your physician or employer advises otherwise.  Round Ligament Pain/Pelvic Discomfort:  Sharp, shooting pains not associated with bleeding are fairly common, usually occurring in the second trimester of pregnancy.  They tend to be worse when standing up or when you remain standing for long periods of time.  These are the result of pressure of certain pelvic ligaments called "round ligaments".  Rest, Tylenol and heat seem to be the most effective relief.  As the womb and fetus grow, they rise out of the pelvis and the discomfort improves.  Please notify the office if your pain seems different than that described.  It may represent a more serious condition.

## 2023-08-30 ENCOUNTER — Ambulatory Visit
Admission: RE | Admit: 2023-08-30 | Discharge: 2023-08-30 | Disposition: A | Discharge status: Home / Self Care | Payer: BC Managed Care – PPO | Source: Ambulatory Visit | Attending: Certified Nurse Midwife | Admitting: Certified Nurse Midwife

## 2023-08-30 DIAGNOSIS — Z3480 Encounter for supervision of other normal pregnancy, unspecified trimester: Secondary | ICD-10-CM | POA: Insufficient documentation

## 2023-08-30 DIAGNOSIS — Z3A13 13 weeks gestation of pregnancy: Secondary | ICD-10-CM | POA: Insufficient documentation

## 2023-08-30 DIAGNOSIS — Z3687 Encounter for antenatal screening for uncertain dates: Secondary | ICD-10-CM | POA: Insufficient documentation

## 2023-09-05 ENCOUNTER — Other Ambulatory Visit (HOSPITAL_COMMUNITY)
Admission: RE | Admit: 2023-09-05 | Discharge: 2023-09-05 | Disposition: A | Discharge status: Home / Self Care | Payer: BC Managed Care – PPO | Source: Ambulatory Visit | Attending: Certified Nurse Midwife | Admitting: Certified Nurse Midwife

## 2023-09-05 ENCOUNTER — Ambulatory Visit (INDEPENDENT_AMBULATORY_CARE_PROVIDER_SITE_OTHER): Payer: BC Managed Care – PPO | Admitting: Certified Nurse Midwife

## 2023-09-05 ENCOUNTER — Encounter: Payer: Self-pay | Admitting: Certified Nurse Midwife

## 2023-09-05 VITALS — BP 131/85 | HR 98 | Wt 163.0 lb

## 2023-09-05 DIAGNOSIS — Z131 Encounter for screening for diabetes mellitus: Secondary | ICD-10-CM

## 2023-09-05 DIAGNOSIS — G43719 Chronic migraine without aura, intractable, without status migrainosus: Secondary | ICD-10-CM

## 2023-09-05 DIAGNOSIS — Z13220 Encounter for screening for lipoid disorders: Secondary | ICD-10-CM

## 2023-09-05 DIAGNOSIS — Z3482 Encounter for supervision of other normal pregnancy, second trimester: Secondary | ICD-10-CM | POA: Diagnosis not present

## 2023-09-05 DIAGNOSIS — Z1151 Encounter for screening for human papillomavirus (HPV): Secondary | ICD-10-CM | POA: Diagnosis present

## 2023-09-05 DIAGNOSIS — Z124 Encounter for screening for malignant neoplasm of cervix: Secondary | ICD-10-CM

## 2023-09-05 DIAGNOSIS — Z0184 Encounter for antibody response examination: Secondary | ICD-10-CM

## 2023-09-05 DIAGNOSIS — Z1379 Encounter for other screening for genetic and chromosomal anomalies: Secondary | ICD-10-CM

## 2023-09-05 DIAGNOSIS — Z113 Encounter for screening for infections with a predominantly sexual mode of transmission: Secondary | ICD-10-CM

## 2023-09-05 DIAGNOSIS — Z8659 Personal history of other mental and behavioral disorders: Secondary | ICD-10-CM

## 2023-09-05 DIAGNOSIS — Z0283 Encounter for blood-alcohol and blood-drug test: Secondary | ICD-10-CM

## 2023-09-05 DIAGNOSIS — Z369 Encounter for antenatal screening, unspecified: Secondary | ICD-10-CM

## 2023-09-05 DIAGNOSIS — Z3A14 14 weeks gestation of pregnancy: Secondary | ICD-10-CM | POA: Diagnosis not present

## 2023-09-05 DIAGNOSIS — D649 Anemia, unspecified: Secondary | ICD-10-CM

## 2023-09-05 MED ORDER — PROPRANOLOL HCL ER 60 MG PO CP24
60.0000 mg | ORAL_CAPSULE | Freq: Every day | ORAL | 2 refills | Status: AC
Start: 2023-09-05 — End: ?

## 2023-09-05 MED ORDER — ONDANSETRON 4 MG PO TBDP
4.0000 mg | ORAL_TABLET | Freq: Three times a day (TID) | ORAL | 1 refills | Status: AC | PRN
Start: 2023-09-05 — End: ?

## 2023-09-05 MED ORDER — ESCITALOPRAM OXALATE 10 MG PO TABS
10.0000 mg | ORAL_TABLET | Freq: Every day | ORAL | 1 refills | Status: DC
Start: 2023-09-05 — End: 2023-11-16

## 2023-09-05 NOTE — Patient Instructions (Signed)
 Second Trimester of Pregnancy  The second trimester of pregnancy is from week 14 through week 27. This is months 4 through 6 of pregnancy. During the second trimester: Morning sickness is less or has stopped. You may have more energy. You may feel hungry more often. At this time, your unborn baby is growing very fast. At the end of the sixth month, the unborn baby may be up to 12 inches long and weigh about 1 pounds. You will likely start to feel the baby move between 16 and 20 weeks of pregnancy. Body changes during your second trimester Your body continues to change during this time. The changes usually go away after your baby is born. Physical changes You will gain more weight. Your belly will get bigger. You may begin to get stretch marks on your hips, belly, and breasts. Your breasts will keep growing and may hurt. You may get dark spots or blotches on your face. A dark line from your belly button to the pubic area may appear. This line is called linea nigra. Your hair may grow faster and get thicker. Health changes You may have headaches. You may have heartburn. You may pee more often. You may have swollen, bulging veins (varicose veins). You may have trouble pooping (constipation), or swollen veins in the butt that can itch or get painful (hemorrhoids). You may have back pain. This is caused by: Weight gain. Pregnancy hormones that are relaxing the joints in your pelvis. Follow these instructions at home: Medicines Talk to your health care provider if you're taking medicines. Ask if the medicines are safe to take during pregnancy. Your provider may change the medicines that you take. Do not take any medicines unless told to by your provider. Take a prenatal vitamin that has at least 600 micrograms (mcg) of folic acid. Do not use herbal medicines, illegal drugs, or medicines that are not approved by your provider. Eating and drinking While you're pregnant your body needs  extra food for your growing baby. Talk with your provider about what to eat while pregnant. Activity Most women are able to exercise during pregnancy. Exercises may need to change as your pregnancy goes on. Talk to your provider about your activities and exercise routines. Relieving pain and discomfort Wear a good, supportive bra if your breasts hurt. Rest with your legs raised if you have leg cramps or low back pain. Take warm sitz baths to soothe pain from hemorrhoids. Use hemorrhoid cream if your provider says it's okay. Do not douche. Do not use tampons or scented pads. Do not use hot tubs, steam rooms, or saunas. Safety Wear your seatbelt at all times when you're in a car. Talk to your provider if someone hits you, hurts you, or yells at you. Talk with your provider if you're feeling sad or have thoughts of hurting yourself. Lifestyle Certain things can be harmful while you're pregnant. It's best to avoid the following: Do not drink alcohol,smoke, vape, or use products with nicotine or tobacco in them. If you need help quitting, talk with your provider. Avoid cat litter boxes and soil used by cats. These things carry germs that can cause harm to your pregnancy and your baby. General instructions Keep all follow-up visits. It helps you and your unborn baby stay as healthy as possible. Write down your questions. Take them to your prenatal visits. Your provider will: Talk with you about your overall health. Give you advice or refer you to specialists who can help with different needs,  including: Prenatal education classes. Mental health and counseling. Foods and healthy eating. Ask for help if you need help with food. Where to find more information American Pregnancy Association: americanpregnancy.org [employer] of [employer]etricians and Gynecologists: acog.org Office on [employer] Health: [employer]Less[employer] Contact a health care provider if: You have a headache that does not go away  when you take medicine. You have any of these problems: You can't eat or drink. You throw up or feel like you may throw up. You have watery poop (diarrhea) for 2 days or more. You have pain when you pee or your pee smells bad. You have been sick for 2 days or more and are not getting better. Contact your provider right away if: You have any of these coming from your vagina: Abnormal discharge. Bad-smelling fluid. Bleeding. Your baby is moving less than usual. You have contractions, belly cramping, or have pain in your pelvis or lower back. You have symptoms of high blood pressure or preeclampsia. These include: A severe, throbbing headache that does not go away. Sudden or extreme swelling of your face, hands, legs, or feet. Vision problems: You see spots. You have blurry vision. Your eyes are sensitive to light. If you can't reach the provider, go to an urgent care or emergency room. Get help right away if: You faint, become confused, or can't think clearly. You have chest pain or trouble breathing. You have any kind of injury, such as from a fall or a car crash. These symptoms may be an emergency. Call 911 right away. Do not wait to see if the symptoms will go away. Do not drive yourself to the hospital. This information is not intended to replace advice given to you by your health care provider. Make sure you discuss any questions you have with your health care provider. Document Revised: 04/18/2023 Document Reviewed: 11/16/2022 Elsevier Patient Education  2024 [employer].

## 2023-09-05 NOTE — Progress Notes (Signed)
 NEW OB HISTORY AND PHYSICAL  SUBJECTIVE:       Toni Foster is a 33 y.o. G15P1001 female, Patient's last menstrual period was 05/30/2023., Estimated Date of Delivery: 03/05/24, [redacted]w[redacted]d, presents today for establishment of Prenatal Care. She reports frequent migraines, one lasting almost two weeks; fatigue; nausea; mood disturbance   Social history Partner/Relationship: husband, Tae Living situation: husband & son, no pets Work: Runner, Broadcasting/film/video  Exercise: caring for son Substance use: denies T/E/D  Indications for ASA therapy (per uptodate) One of the following: Previous pregnancy with preeclampsia, especially early onset and with an adverse outcome No Multifetal gestation No Chronic hypertension No Type 1 or 2 diabetes mellitus No Chronic kidney disease No Autoimmune disease (antiphospholipid syndrome, systemic lupus erythematosus) No  Two or more of the following: Nulliparity No Obesity (body mass index >30 kg/m2) No Family history of preeclampsia in mother or sister No Age >=35 years No Sociodemographic characteristics (African American race, low socioeconomic level) No Personal risk factors (eg, previous pregnancy with low birth weight or small for gestational age infant, previous adverse pregnancy outcome [eg, stillbirth], interval >10 years between pregnancies) No   Gynecologic History Patient's last menstrual period was 05/30/2023. Normal Contraception: none Last Pap: 2022. Results were: normal  Obstetric History OB History  Gravida Para Term Preterm AB Living  2 1 1   1   SAB IAB Ectopic Multiple Live Births     0 1    # Outcome Date GA Lbr Len/2nd Weight Sex Type Anes PTL Lv  2 Current           1 Term 10/24/20 [redacted]w[redacted]d 16:43 / 01:43 7 lb 15 oz (3.6 kg) M Vag-Spont EPI  LIV    Past Medical History:  Diagnosis Date   Medical history non-contributory     Past Surgical History:  Procedure Laterality Date   ANTERIOR CRUCIATE LIGAMENT REPAIR      TONSILLECTOMY     WISDOM TOOTH EXTRACTION      Current Outpatient Medications on File Prior to Visit  Medication Sig Dispense Refill   acetaminophen  (TYLENOL ) 500 MG tablet Take 2 tablets (1,000 mg total) by mouth every 6 (six) hours as needed (for pain scale < 4). 30 tablet 0   Prenatal Vit-Fe Fumarate-FA (MULTIVITAMIN-PRENATAL) 27-0.8 MG TABS tablet Take 1 tablet by mouth at bedtime. 90 tablet 3   No current facility-administered medications on file prior to visit.    Allergies  Allergen Reactions   Wheat Other (See Comments)    Social History   Socioeconomic History   Marital status: Single    Spouse name: Not on file   Number of children: 1   Years of education: 61   Highest education level: Some college, no degree  Occupational History   Not on file  Tobacco Use   Smoking status: Never   Smokeless tobacco: Never  Vaping Use   Vaping status: Never Used  Substance and Sexual Activity   Alcohol use: No   Drug use: Never   Sexual activity: Yes    Birth control/protection: None  Other Topics Concern   Not on file  Social History Narrative   Not on file   Social Drivers of Health   Financial Resource Strain: Low Risk  (08/26/2023)   Overall Financial Resource Strain (CARDIA)    Difficulty of Paying Living Expenses: Not very hard  Food Insecurity: No Food Insecurity (08/26/2023)   Hunger Vital Sign    Worried About Running Out of Food in the  Last Year: Never true    Ran Out of Food in the Last Year: Never true  Transportation Needs: No Transportation Needs (08/26/2023)   PRAPARE - Administrator, Civil Service (Medical): No    Lack of Transportation (Non-Medical): No  Physical Activity: Inactive (08/26/2023)   Exercise Vital Sign    Days of Exercise per Week: 0 days    Minutes of Exercise per Session: 0 min  Stress: Stress Concern Present (08/26/2023)   [employer] of Occupational Health - Occupational Stress Questionnaire    Feeling of Stress :  To some extent  Social Connections: Moderately Isolated (08/26/2023)   Social Connection and Isolation Panel [NHANES]    Frequency of Communication with Friends and Family: More than three times a week    Frequency of Social Gatherings with Friends and Family: Twice a week    Attends Religious Services: Never    Database Administrator or Organizations: No    Attends Banker Meetings: Never    Marital Status: Living with partner  Intimate Partner Violence: Not At Risk (08/26/2023)   Humiliation, Afraid, Rape, and Kick questionnaire    Fear of Current or Ex-Partner: No    Emotionally Abused: No    Physically Abused: No    Sexually Abused: No    Family History  Problem Relation Age of Onset   Fibromyalgia Mother    Depression Mother    Anxiety disorder Mother    Anxiety disorder Father    Depression Father    Migraines Father    Anxiety disorder Sister    Depression Sister    Healthy Sister    Anxiety disorder Brother    Depression Brother    Healthy Brother    Breast cancer Maternal Grandmother 40   Parkinson's disease Maternal Grandfather    Alzheimer's disease Paternal Grandmother    Alzheimer's disease Paternal Grandfather     The following portions of the patient's history were reviewed and updated as appropriate: allergies, current medications, past OB history, past medical history, past surgical history, past family history, past social history, and problem list.  Constitutional: Denied constitutional symptoms, night sweats, recent illness, fever, insomnia and weight loss.  Eyes: Denied eye symptoms, eye pain, photophobia, vision change and visual disturbance.  Ears/Nose/Throat/Neck: Denied ear, nose, throat or neck symptoms, hearing loss, nasal discharge, sinus congestion and sore throat.  Cardiovascular: Denied cardiovascular symptoms, arrhythmia, chest pain/pressure, edema, exercise intolerance, orthopnea and palpitations.  Respiratory: Denied pulmonary  symptoms, asthma, pleuritic pain, productive sputum, cough, dyspnea and wheezing.  Gastrointestinal: Denied gastro-esophageal reflux, melena, nausea and vomiting.  Genitourinary: Denied genitourinary symptoms including symptomatic vaginal discharge, pelvic relaxation issues, and urinary complaints.  Musculoskeletal: Denied musculoskeletal symptoms, stiffness, swelling, muscle weakness and myalgia.  Dermatologic: Denied dermatology symptoms, rash and scar.  Neurologic: Denied neurology symptoms, dizziness, headache, neck pain and syncope.  Psychiatric: Endorses irritability.  Endocrine: Denied endocrine symptoms including hot flashes and night sweats.     OBJECTIVE: Initial Physical Exam (New OB)  GENERAL APPEARANCE: alert, well appearing, in no apparent distress, oriented to person, place and time HEAD: normocephalic, atraumatic MOUTH: mucous membranes moist, pharynx normal without lesions THYROID: no thyromegaly or masses present BREASTS: no masses noted, no significant tenderness, no palpable axillary nodes, no skin changes LUNGS: clear to auscultation, no wheezes, rales or rhonchi, symmetric air entry HEART: regular rate and rhythm, no murmurs ABDOMEN: soft, nontender, nondistended, no abnormal masses, no epigastric pain NEUROLOGIC: alert, oriented, normal speech, no focal  findings or movement disorder noted  PELVIC EXAM EXTERNAL GENITALIA: normal appearing vulva with no masses, tenderness or lesions VAGINA: no abnormal discharge or lesions CERVIX: no lesions, pap collected  FHR:  ASSESSMENT: Normal pregnancy   PLAN: Routine prenatal care. We discussed an overview of prenatal care and when to call. Reviewed diet, exercise, and weight gain recommendations in pregnancy. Discussed benefits of breastfeeding and lactation resources at [facility]. I reviewed labs and answered all questions.  -Start Lexapro  5mg  once daily for 6 days then increase to 10mg  daily. Postpartum support  international information sent via MyChart. -Restart Propanolol 60mg  extended release with hope of migraine prevention -Zofran  prn ordered for nausea associated with headaches.   1. Anemia, unspecified type (Primary) - NOB Panel - Culture, OB Urine - Monitor Drug Profile 14(MW) - Nicotine  screen, urine - Urinalysis, Routine w reflex microscopic - MaterniT 21 plus Core, Blood - Comprehensive metabolic panel - HgB A1c - TSH  2. Encounter for drug screening - NOB Panel - Culture, OB Urine - Monitor Drug Profile 14(MW) - Nicotine  screen, urine - Urinalysis, Routine w reflex microscopic - MaterniT 21 plus Core, Blood - Comprehensive metabolic panel - HgB A1c - TSH  3. Encounter for screening for diabetes mellitus - NOB Panel - Culture, OB Urine - Monitor Drug Profile 14(MW) - Nicotine  screen, urine - Urinalysis, Routine w reflex microscopic - MaterniT 21 plus Core, Blood - Comprehensive metabolic panel - HgB A1c - TSH  4. Genetic screening - NOB Panel - Culture, OB Urine - Monitor Drug Profile 14(MW) - Nicotine  screen, urine - Urinalysis, Routine w reflex microscopic - MaterniT 21 plus Core, Blood - Comprehensive metabolic panel - HgB A1c - TSH  5. Immunity status testing - NOB Panel - Culture, OB Urine - Monitor Drug Profile 14(MW) - Nicotine  screen, urine - Urinalysis, Routine w reflex microscopic - MaterniT 21 plus Core, Blood - Comprehensive metabolic panel - HgB A1c - TSH  6. Lipid screening - NOB Panel - Culture, OB Urine - Monitor Drug Profile 14(MW) - Nicotine  screen, urine - Urinalysis, Routine w reflex microscopic - MaterniT 21 plus Core, Blood - Comprehensive metabolic panel - HgB A1c - TSH  7. Screening for STDs (sexually transmitted diseases) - NOB Panel - Culture, OB Urine - Monitor Drug Profile 14(MW) - Nicotine  screen, urine - Urinalysis, Routine w reflex microscopic - MaterniT 21 plus Core, Blood - Comprehensive metabolic  panel - HgB A1c - TSH  8. Cervical cancer screening - Cytology - PAP  9. Encounter for screening for human papillomavirus (HPV) - Cytology - PAP  10. 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 History of postpartum depression, currently pregnant   Harlene LITTIE Cisco, CNM

## 2023-09-06 ENCOUNTER — Encounter: Payer: Self-pay | Admitting: Certified Nurse Midwife

## 2023-09-06 DIAGNOSIS — O99891 Other specified diseases and conditions complicating pregnancy: Secondary | ICD-10-CM | POA: Insufficient documentation

## 2023-09-06 DIAGNOSIS — Z8659 Personal history of other mental and behavioral disorders: Secondary | ICD-10-CM | POA: Insufficient documentation

## 2023-09-06 LAB — COMPREHENSIVE METABOLIC PANEL
ALT: 10 [IU]/L (ref 0–32)
AST: 15 [IU]/L (ref 0–40)
Albumin: 4 g/dL (ref 4.0–5.0)
Alkaline Phosphatase: 59 [IU]/L (ref 44–121)
BUN/Creatinine Ratio: 8 — ABNORMAL LOW (ref 9–23)
BUN: 5 mg/dL — ABNORMAL LOW (ref 6–20)
Bilirubin Total: <0.2 mg/dL (ref 0.0–1.2)
CO2: 19 mmol/L — ABNORMAL LOW (ref 20–29)
Calcium: 9.3 mg/dL (ref 8.7–10.2)
Chloride: 105 mmol/L (ref 96–106)
Creatinine, Ser: 0.64 mg/dL (ref 0.57–1.00)
Globulin, Total: 2.3 g/dL (ref 1.5–4.5)
Glucose: 81 mg/dL (ref 70–99)
Potassium: 4.2 mmol/L (ref 3.5–5.2)
Sodium: 138 mmol/L (ref 134–144)
Total Protein: 6.3 g/dL (ref 6.0–8.5)
eGFR: 122 mL/min/{1.73_m2} (ref >59)

## 2023-09-06 LAB — CBC/D/PLT+RPR+RH+ABO+RUBIGG...
Antibody Screen: NEGATIVE
Basophils Absolute: 0 10*3/uL (ref 0.0–0.2)
Basos: 0 %
EOS (ABSOLUTE): 0.3 10*3/uL (ref 0.0–0.4)
Eos: 3 %
HCV Ab: NONREACTIVE
HIV Screen 4th Generation wRfx: NONREACTIVE
Hematocrit: 38.2 % (ref 34.0–46.6)
Hemoglobin: 12.6 g/dL (ref 11.1–15.9)
Hepatitis B Surface Ag: NEGATIVE
Immature Grans (Abs): 0 10*3/uL (ref 0.0–0.1)
Immature Granulocytes: 0 %
Lymphocytes Absolute: 2.4 10*3/uL (ref 0.7–3.1)
Lymphs: 25 %
MCH: 30.5 pg (ref 26.6–33.0)
MCHC: 33 g/dL (ref 31.5–35.7)
MCV: 93 fL (ref 79–97)
Monocytes Absolute: 0.4 10*3/uL (ref 0.1–0.9)
Monocytes: 4 %
Neutrophils Absolute: 6.4 10*3/uL (ref 1.4–7.0)
Neutrophils: 68 %
Platelets: 316 10*3/uL (ref 150–450)
RBC: 4.13 x10E6/uL (ref 3.77–5.28)
RDW: 13.2 % (ref 11.7–15.4)
RPR Ser Ql: NONREACTIVE
Rh Factor: POSITIVE
Rubella Antibodies, IGG: 2.65 {index} (ref >0.99)
Varicella zoster IgG: REACTIVE
WBC: 9.6 10*3/uL (ref 3.4–10.8)

## 2023-09-06 LAB — HCV INTERPRETATION

## 2023-09-06 LAB — URINALYSIS, ROUTINE W REFLEX MICROSCOPIC
Bilirubin, UA: NEGATIVE
Glucose, UA: NEGATIVE
Ketones, UA: NEGATIVE
Nitrite, UA: NEGATIVE
Protein,UA: NEGATIVE
RBC, UA: NEGATIVE
Specific Gravity, UA: 1.009 (ref 1.005–1.030)
Urobilinogen, Ur: 0.2 mg/dL (ref 0.2–1.0)
pH, UA: 7 (ref 5.0–7.5)

## 2023-09-06 LAB — MICROSCOPIC EXAMINATION
Bacteria, UA: NONE SEEN
Casts: NONE SEEN /[LPF]
RBC, Urine: NONE SEEN /[HPF] (ref 0–2)
WBC, UA: NONE SEEN /[HPF] (ref 0–5)

## 2023-09-06 LAB — HEMOGLOBIN A1C
Est. average glucose Bld gHb Est-mCnc: 94 mg/dL
Hgb A1c MFr Bld: 4.9 % (ref 4.8–5.6)

## 2023-09-06 LAB — TSH: TSH: 0.575 u[IU]/mL (ref 0.450–4.500)

## 2023-09-06 NOTE — Assessment & Plan Note (Signed)
 Reports increased irritability & anxiety. Stopped ADD meds with +UPT. Has taken Zoloft, Pristiq, Celexa in the past. Zoloft helped some but not quickly. Pristiq didn't seem to make a difference.

## 2023-09-06 NOTE — Assessment & Plan Note (Signed)
 Restart Propanolol for prophylaxis, took in prior pregnancy with good response.

## 2023-09-07 ENCOUNTER — Encounter: Payer: Self-pay | Admitting: Certified Nurse Midwife

## 2023-09-07 LAB — MONITOR DRUG PROFILE 14(MW)
Amphetamine Scrn, Ur: NEGATIVE ng/mL
BARBITURATE SCREEN URINE: NEGATIVE ng/mL
BENZODIAZEPINE SCREEN, URINE: NEGATIVE ng/mL
Buprenorphine, Urine: NEGATIVE ng/mL
CANNABINOIDS UR QL SCN: NEGATIVE ng/mL
Cocaine (Metab) Scrn, Ur: NEGATIVE ng/mL
Creatinine(Crt), U: 38.1 mg/dL (ref 20.0–300.0)
Fentanyl, Urine: NEGATIVE pg/mL
Meperidine Screen, Urine: NEGATIVE ng/mL
Methadone Screen, Urine: NEGATIVE ng/mL
OXYCODONE+OXYMORPHONE UR QL SCN: NEGATIVE ng/mL
Opiate Scrn, Ur: NEGATIVE ng/mL
Ph of Urine: 6.7 (ref 4.5–8.9)
Phencyclidine Qn, Ur: NEGATIVE ng/mL
Propoxyphene Scrn, Ur: NEGATIVE ng/mL
SPECIFIC GRAVITY: 1.009
Tramadol Screen, Urine: NEGATIVE ng/mL

## 2023-09-07 LAB — URINE CULTURE, OB REFLEX

## 2023-09-07 LAB — CULTURE, OB URINE

## 2023-09-07 LAB — NICOTINE SCREEN, URINE: Cotinine Ql Scrn, Ur: NEGATIVE ng/mL

## 2023-09-09 LAB — MATERNIT 21 PLUS CORE, BLOOD
Fetal Fraction: 19
Result (T21): NEGATIVE
Trisomy 13 (Patau syndrome): NEGATIVE
Trisomy 18 (Edwards syndrome): NEGATIVE
Trisomy 21 (Down syndrome): NEGATIVE

## 2023-09-11 LAB — CYTOLOGY - PAP
Chlamydia: NEGATIVE
Comment: NEGATIVE
Comment: NEGATIVE
Comment: NORMAL
Diagnosis: NEGATIVE
High risk HPV: NEGATIVE
Neisseria Gonorrhea: NEGATIVE

## 2023-10-03 ENCOUNTER — Encounter: Payer: Self-pay | Admitting: Certified Nurse Midwife

## 2023-10-03 ENCOUNTER — Ambulatory Visit: Payer: BC Managed Care – PPO | Admitting: Certified Nurse Midwife

## 2023-10-03 VITALS — BP 107/62 | HR 68 | Wt 161.0 lb

## 2023-10-03 DIAGNOSIS — F419 Anxiety disorder, unspecified: Secondary | ICD-10-CM

## 2023-10-03 DIAGNOSIS — G43719 Chronic migraine without aura, intractable, without status migrainosus: Secondary | ICD-10-CM

## 2023-10-03 DIAGNOSIS — O99342 Other mental disorders complicating pregnancy, second trimester: Secondary | ICD-10-CM | POA: Diagnosis not present

## 2023-10-03 DIAGNOSIS — Z8659 Personal history of other mental and behavioral disorders: Secondary | ICD-10-CM

## 2023-10-03 DIAGNOSIS — O99352 Diseases of the nervous system complicating pregnancy, second trimester: Secondary | ICD-10-CM | POA: Diagnosis not present

## 2023-10-03 DIAGNOSIS — F32A Depression, unspecified: Secondary | ICD-10-CM | POA: Diagnosis not present

## 2023-10-03 DIAGNOSIS — F988 Other specified behavioral and emotional disorders with onset usually occurring in childhood and adolescence: Secondary | ICD-10-CM

## 2023-10-03 DIAGNOSIS — Z3480 Encounter for supervision of other normal pregnancy, unspecified trimester: Secondary | ICD-10-CM

## 2023-10-03 DIAGNOSIS — Z3689 Encounter for other specified antenatal screening: Secondary | ICD-10-CM

## 2023-10-03 DIAGNOSIS — Z3A18 18 weeks gestation of pregnancy: Secondary | ICD-10-CM

## 2023-10-03 NOTE — Assessment & Plan Note (Signed)
 Improved now that she is taking propanolol again.

## 2023-10-03 NOTE — Assessment & Plan Note (Signed)
 Reviewed comfort measures for round ligament pain.  May desire BTL if she needs a c/s Anatomy US ordered.

## 2023-10-03 NOTE — Assessment & Plan Note (Signed)
 Doing better with lexapro. May need to increase dosage but want to wait for now.

## 2023-10-03 NOTE — Progress Notes (Signed)
    Return Prenatal Note   Assessment/Plan   Plan  33 y.o. G2P1001 at [redacted]w[redacted]d presents for follow-up OB visit. Reviewed prenatal record including previous visit note.  Intractable chronic migraine without aura and without status migrainosus Improved now that she is taking propanolol again.   Anxiety and depression Doing better with lexapro. May need to increase dosage but want to wait for now.   Supervision of other normal pregnancy, antepartum Reviewed comfort measures for round ligament pain.  May desire BTL if she needs a c/s Anatomy US ordered.    Orders Placed This Encounter  Procedures   US OB Comp + 14 Wk    Standing Status:   Future    Expiration Date:   04/04/2024    Reason for Exam (SYMPTOM  OR DIAGNOSIS REQUIRED):   anatomy    Preferred Imaging Location?:   Internal   No follow-ups on file.   Future Appointments  Date Time Provider Department Center  11/01/2023  3:00 PM [facility] [place] [facility] None  11/01/2023  3:55 PM Dominica Severin, CNM [facility] None    For next visit:  continue with routine prenatal care     Subjective   32 y.o. G2P1001 at [redacted]w[redacted]d presents for this follow-up prenatal visit.  Patient sharp pelvic pain with sneezing and some occasional cramping.  Patient reports: Movement: Present Contractions: Not present  Objective   Flow sheet Vitals: Pulse Rate: 68 BP: 107/62 Fundal Height: 18 cm Fetal Heart Rate (bpm): 140 Total weight gain: -4 lb (-1.814 kg)  General Appearance  No acute distress, well appearing, and well nourished Pulmonary   Normal work of breathing Neurologic   Alert and oriented to person, place, and time Psychiatric   Mood and affect within normal limits  Oley Balm, CNM  03/06/254:20 PM

## 2023-10-05 ENCOUNTER — Other Ambulatory Visit: Payer: Self-pay

## 2023-10-05 ENCOUNTER — Emergency Department (HOSPITAL_BASED_OUTPATIENT_CLINIC_OR_DEPARTMENT_OTHER)
Admission: EM | Admit: 2023-10-05 | Discharge: 2023-10-05 | Disposition: A | Discharge status: Home / Self Care | Attending: Emergency Medicine | Admitting: Emergency Medicine

## 2023-10-05 DIAGNOSIS — N3090 Cystitis, unspecified without hematuria: Secondary | ICD-10-CM | POA: Insufficient documentation

## 2023-10-05 DIAGNOSIS — O26892 Other specified pregnancy related conditions, second trimester: Secondary | ICD-10-CM | POA: Diagnosis present

## 2023-10-05 DIAGNOSIS — R112 Nausea with vomiting, unspecified: Secondary | ICD-10-CM | POA: Insufficient documentation

## 2023-10-05 DIAGNOSIS — E860 Dehydration: Secondary | ICD-10-CM | POA: Diagnosis not present

## 2023-10-05 DIAGNOSIS — Z3A18 18 weeks gestation of pregnancy: Secondary | ICD-10-CM | POA: Insufficient documentation

## 2023-10-05 LAB — CBC
HCT: 34.5 % — ABNORMAL LOW (ref 36.0–46.0)
Hemoglobin: 11.5 g/dL — ABNORMAL LOW (ref 12.0–15.0)
MCH: 30.8 pg (ref 26.0–34.0)
MCHC: 33.3 g/dL (ref 30.0–36.0)
MCV: 92.5 fL (ref 80.0–100.0)
Platelets: 210 10*3/uL (ref 150–400)
RBC: 3.73 MIL/uL — ABNORMAL LOW (ref 3.87–5.11)
RDW: 14 % (ref 11.5–15.5)
WBC: 5.1 10*3/uL (ref 4.0–10.5)
nRBC: 0 % (ref 0.0–0.2)

## 2023-10-05 LAB — URINALYSIS, ROUTINE W REFLEX MICROSCOPIC
Bilirubin Urine: NEGATIVE
Glucose, UA: NEGATIVE mg/dL
Hgb urine dipstick: NEGATIVE
Ketones, ur: 40 mg/dL — AB
Nitrite: NEGATIVE
Protein, ur: NEGATIVE mg/dL
Specific Gravity, Urine: 1.025 (ref 1.005–1.030)
pH: 7 (ref 5.0–8.0)

## 2023-10-05 LAB — COMPREHENSIVE METABOLIC PANEL
ALT: 13 U/L (ref 0–44)
AST: 18 U/L (ref 15–41)
Albumin: 3.2 g/dL — ABNORMAL LOW (ref 3.5–5.0)
Alkaline Phosphatase: 51 U/L (ref 38–126)
Anion gap: 7 (ref 5–15)
BUN: 7 mg/dL (ref 6–20)
CO2: 23 mmol/L (ref 22–32)
Calcium: 8.6 mg/dL — ABNORMAL LOW (ref 8.9–10.3)
Chloride: 107 mmol/L (ref 98–111)
Creatinine, Ser: 0.56 mg/dL (ref 0.44–1.00)
GFR, Estimated: >60 mL/min (ref >60)
Glucose, Bld: 88 mg/dL (ref 70–99)
Potassium: 3.6 mmol/L (ref 3.5–5.1)
Sodium: 137 mmol/L (ref 135–145)
Total Bilirubin: 0.6 mg/dL (ref 0.0–1.2)
Total Protein: 6.5 g/dL (ref 6.5–8.1)

## 2023-10-05 LAB — RESP PANEL BY RT-PCR (RSV, FLU A&B, COVID)  RVPGX2
Influenza A by PCR: NEGATIVE
Influenza B by PCR: NEGATIVE
Resp Syncytial Virus by PCR: NEGATIVE
SARS Coronavirus 2 by RT PCR: NEGATIVE

## 2023-10-05 LAB — URINALYSIS, MICROSCOPIC (REFLEX)

## 2023-10-05 LAB — LIPASE, BLOOD: Lipase: 24 U/L (ref 11–51)

## 2023-10-05 MED ORDER — SODIUM CHLORIDE 0.9 % IV SOLN
12.5000 mg | Freq: Four times a day (QID) | INTRAVENOUS | Status: DC | PRN
  Filled 2023-10-05: qty 0.5

## 2023-10-05 MED ORDER — CEFADROXIL 500 MG PO CAPS: 500.0000 mg | ORAL_CAPSULE | Freq: Two times a day (BID) | ORAL | 0 refills | Status: DC

## 2023-10-05 MED ORDER — PROMETHAZINE HCL 25 MG/ML IJ SOLN
INTRAMUSCULAR | Status: AC
  Administered 2023-10-05: 12.5 mg
  Filled 2023-10-05: qty 1

## 2023-10-05 MED ORDER — SODIUM CHLORIDE 0.9 % IV BOLUS
1000.0000 mL | Freq: Once | INTRAVENOUS | Status: AC
  Administered 2023-10-05: 1000 mL via INTRAVENOUS

## 2023-10-05 MED ORDER — ONDANSETRON HCL 4 MG/2ML IJ SOLN
4.0000 mg | Freq: Once | INTRAMUSCULAR | Status: AC
  Administered 2023-10-05: 4 mg via INTRAVENOUS
  Filled 2023-10-05: qty 2

## 2023-10-05 NOTE — ED Notes (Signed)
 Discharge paperwork reviewed entirely with patient, including follow up care. Pain was under control. The patient received instruction and coaching on their prescriptions, and all follow-up questions were answered.  Pt verbalized understanding as well as all parties involved. No questions or concerns voiced at the time of discharge. No acute distress noted.   Pt ambulated out to PVA without incident or assistance.  Pt advised they will seek followup care with a specialist and followup with their PCP.   The pt was instructed to set up and/or review MyChart for their results; and was informed their Providers all have access to the information as well.

## 2023-10-05 NOTE — ED Provider Notes (Signed)
 [facility]MENT AT [facility] Provider Note   CSN: [id number] Arrival date & time: 10/05/23  6283     History  Chief Complaint  Patient presents with   Emesis    Toni Foster is a 33 y.o. female with overall noncontributory past medical history, 1 previous pregnancy who presents for nausea, vomiting and mild abdominal discomfort since last night.  Patient is [redacted] weeks pregnant, no complications so far with this pregnancy, no complications with previous pregnancy.  She denies any dysuria, hematuria. she reports she was having persistent vomiting despite taking home zofran. Denies chest pain, shob. Vaginal bleeding or discharge.   Emesis      Home Medications Prior to Admission medications   Medication Sig Start Date End Date Taking? Authorizing Provider  cefadroxil (DURICEF) 500 MG capsule Take 1 capsule (500 mg total) by mouth 2 (two) times daily. 10/05/23  Yes Bird Swetz H, PA-C  acetaminophen (TYLENOL) 500 MG tablet Take 2 tablets (1,000 mg total) by mouth every 6 (six) hours as needed (for pain scale < 4). 10/25/20   Gustavo Lah, CNM  escitalopram (LEXAPRO) 10 MG tablet Take 1 tablet (10 mg total) by mouth daily. Take 1/2 tablet, 5mg  by mouth once daily for 6 days then increase to whole tablet, 10mg  once daily. 09/05/23   Dominica Severin, CNM  ondansetron (ZOFRAN-ODT) 4 MG disintegrating tablet Take 1 tablet (4 mg total) by mouth every 8 (eight) hours as needed for nausea or vomiting. 09/05/23   Dominica Severin, CNM  Prenatal Vit-Fe Fumarate-FA (MULTIVITAMIN-PRENATAL) 27-0.8 MG TABS tablet Take 1 tablet by mouth at bedtime. 07/17/23   Julieanne Manson, MD  propranolol ER (INDERAL LA) 60 MG 24 hr capsule Take 1 capsule (60 mg total) by mouth daily. 09/05/23   Dominica Severin, CNM      Allergies    Wheat    Review of Systems   Review of Systems  Gastrointestinal:  Positive for vomiting.  All other systems reviewed and are  negative.   Physical Exam Updated Vital Signs BP 108/71 (BP Location: Left Arm)   Pulse 74   Temp 98.2 F (36.8 C) (Oral)   Resp 16   Ht 5\' 2"  (1.575 m)   Wt 72.6 kg   LMP 05/30/2023   SpO2 100%   BMI 29.26 kg/m  Physical Exam Vitals and nursing note reviewed.  Constitutional:      General: She is not in acute distress.    Appearance: Normal appearance.  HENT:     Head: Normocephalic and atraumatic.  Eyes:     General:        Right eye: No discharge.        Left eye: No discharge.  Cardiovascular:     Rate and Rhythm: Normal rate and regular rhythm.     Heart sounds: No murmur heard.    No friction rub. No gallop.  Pulmonary:     Effort: Pulmonary effort is normal.     Breath sounds: Normal breath sounds.  Abdominal:     General: Bowel sounds are normal.     Palpations: Abdomen is soft.     Comments: Mild tenderness to palpation with no rebound, rigidity, guarding in the lower abdomen, no focal McBurney's point tenderness.  No significant upper abdominal tenderness, negative Murphy sign  Skin:    General: Skin is warm and dry.     Capillary Refill: Capillary refill takes less than 2 seconds.  Neurological:  Mental Status: She is alert and oriented to person, place, and time.  Psychiatric:        Mood and Affect: Mood normal.        Behavior: Behavior normal.     ED Results / Procedures / Treatments   Labs (all labs ordered are listed, but only abnormal results are displayed) Labs Reviewed  COMPREHENSIVE METABOLIC PANEL - Abnormal; Notable for the following components:      Result Value   Calcium 8.6 (*)    Albumin 3.2 (*)    All other components within normal limits  CBC - Abnormal; Notable for the following components:   RBC 3.73 (*)    Hemoglobin 11.5 (*)    HCT 34.5 (*)    All other components within normal limits  URINALYSIS, ROUTINE W REFLEX MICROSCOPIC - Abnormal; Notable for the following components:   Ketones, ur 40 (*)    Leukocytes,Ua  SMALL (*)    All other components within normal limits  URINALYSIS, MICROSCOPIC (REFLEX) - Abnormal; Notable for the following components:   Bacteria, UA FEW (*)    All other components within normal limits  RESP PANEL BY RT-PCR (RSV, FLU A&B, COVID)  RVPGX2  URINE CULTURE  LIPASE, BLOOD    EKG None  Radiology No results found.  Procedures Procedures    Medications Ordered in ED Medications  promethazine (PHENERGAN) 12.5 mg in sodium chloride 0.9 % 50 mL IVPB (has no administration in time range)  sodium chloride 0.9 % bolus 1,000 mL (1,000 mLs Intravenous New Bag/Given 10/05/23 0939)  ondansetron (ZOFRAN) injection 4 mg (4 mg Intravenous Given 10/05/23 0941)    ED Course/ Medical Decision Making/ A&P                                 Medical Decision Making Amount and/or Complexity of Data Reviewed Labs: ordered.  Risk Prescription drug management.   This patient is a 33 y.o. female  who presents to the ED for concern of nausea, vomiting, possible dehydration, abdominal cramping.   Differential diagnoses prior to evaluation: The emergent differential diagnosis includes, but is not limited to,  The causes of generalized abdominal pain include but are not limited to AAA, mesenteric ischemia, appendicitis, diverticulitis, DKA, gastritis, gastroenteritis, AMI, nephrolithiasis, pancreatitis, peritonitis, adrenal insufficiency,lead poisoning, iron toxicity, intestinal ischemia, constipation, UTI,SBO/LBO, splenic rupture, biliary disease, IBD, IBS, PUD, or hepatitis --in context of her pregnancy consider miscarriage, subchorionic hemorrhage, pre-eclampsia, eclampsia, other pregnancy complications. this is not an exhaustive differential.   Past Medical History / Co-morbidities / Social History: Overall noncontributory. G2P1  Additional history: Chart reviewed. Pertinent results include: reviewed outpatient obgyn visits prior to arrival  Physical Exam: Physical exam performed.  The pertinent findings include: Mild tenderness to palpation of the lower abdomen with no rebound, normal fetal heart tones, heart rate 166.  Stable vitals of patient, blood pressure 108/71, afebrile, normal heart rate and stable oxygen saturation on room air.  Lab Tests/Imaging studies: I personally interpreted labs/imaging and the pertinent results include: CBC with mild anemia, hemoglobin 11.5, CMP overall unremarkable, mild hypocalcemia, calcium 8.6, RVP negative for COVID, flu, RSV.  Lipase normal.  Her UA is somewhat equivocal, does suggest some mild dehydration 40 ketones, there are small leukocytes, few bacteria and few white blood cells, although there is some squamous contamination that, in context of her pregnancy will treat for possible UTI.    Medications: I ordered medication  including zofran, fluids. Some improvement but mild ongoing nausea. Will give onetime dose of phenergan but recommend she reach out to [facility] for longterm nausea medication adjustments.  I have reviewed the patients home medicines and have made adjustments as needed.   Disposition: After consideration of the diagnostic results and the patients response to treatment, I feel that patient able to tolerate PO, reassuring labwork and exam, feeling improved, okay for discharge with close follow up .   emergency department workup does not suggest an emergent condition requiring admission or immediate intervention beyond what has been performed at this time. The plan is: as above. The patient is safe for discharge and has been instructed to return immediately for worsening symptoms, change in symptoms or any other concerns.  Final Clinical Impression(s) / ED Diagnoses Final diagnoses:  Nausea and vomiting, unspecified vomiting type  Dehydration  Cystitis    Rx / DC Orders ED Discharge Orders          Ordered    cefadroxil (DURICEF) 500 MG capsule  2 times daily        10/05/23 1014              Zakyria Metzinger,  [place], PA-C 10/05/23 1037    Linwood Dibbles, MD 10/06/23 [phone]

## 2023-10-05 NOTE — Discharge Instructions (Addendum)
 Please drink plenty of fluids including electrolyte containing fluids such as pedialyte, gatorade to help with dehydration.  If your nausea persists, you can take Zofran every 6 hours instead of every 8 hours but try to stick to every 8 hours if you can tolerate it.  Follow-up closely with your OB/GYN, return to the emergency department if your nausea, vomiting or abdominal pain worsen despite treatment.   Please take the entire course of antibiotics that we have prescribed.

## 2023-10-05 NOTE — ED Triage Notes (Signed)
 Pt states that she is [redacted] weeks pregnant and that she started vomiting last night. States that her OB doctor sent her here for evaluation for dehydration.

## 2023-10-05 NOTE — ED Notes (Signed)
 Pt advised nausea is improved but still persistent, dry heaving. Pt tolerated gingerale but still feels sick on her stomach. Pt given food and EDP aware; pt will hold off on food PO challenge until phenergan admin.

## 2023-10-06 LAB — URINE CULTURE: Culture: <10000 — AB

## 2023-10-10 ENCOUNTER — Telehealth: Payer: Self-pay

## 2023-10-10 ENCOUNTER — Other Ambulatory Visit: Payer: Self-pay | Admitting: Certified Nurse Midwife

## 2023-10-10 MED ORDER — PROMETHAZINE HCL 12.5 MG RE SUPP: 12.5000 mg | Freq: Four times a day (QID) | RECTAL | 2 refills | Status: DC | PRN

## 2023-10-10 NOTE — Telephone Encounter (Signed)
 Patient states symptoms of Nasuea/vomiting Patient states she has  been able to tolerate liquids for the last 12 hours. No signs of dehydration are present.   Plan: Advise saltine crackers before getting out of bed, eating small bland meals, do not allow and empty or overly full stomach, avoid greasy, acidic or spicy foods, try ginger 250 mg 4xday or ginger tea and to try B6 3xday along with Unisom.  Advised will send message to on call provider for review and possible rx for Phenergan suppository as patient currently not tolerating PO meds.

## 2023-10-10 NOTE — Telephone Encounter (Signed)
 TRIAGE VOICEMAIL: Patient states she was in the ED on Saturday for severe nausea/vomiting. She was given Zofran. She just thru that up as well.

## 2023-10-17 ENCOUNTER — Encounter: Payer: Self-pay | Admitting: Certified Nurse Midwife

## 2023-10-24 ENCOUNTER — Telehealth: Payer: Self-pay

## 2023-10-24 NOTE — Telephone Encounter (Addendum)
 Patient states she had chest pain during intercourse last night with mild heart palpitations that lasted about 10 minutes and was relieved with taking deep breaths, but did scare her to tears.  She advised she has had some intermittent heart palpitations this pregnancy, but no chest pain. She states a midwife advised her the heart palpitations during pregnancy are normal due to increase in blood flow.

## 2023-10-24 NOTE — Telephone Encounter (Signed)
 Patient aware.

## 2023-11-01 ENCOUNTER — Ambulatory Visit (INDEPENDENT_AMBULATORY_CARE_PROVIDER_SITE_OTHER): Admitting: Certified Nurse Midwife

## 2023-11-01 ENCOUNTER — Ambulatory Visit

## 2023-11-01 ENCOUNTER — Encounter: Admitting: Certified Nurse Midwife

## 2023-11-01 VITALS — BP 111/72 | HR 72 | Wt 165.7 lb

## 2023-11-01 DIAGNOSIS — Z3A22 22 weeks gestation of pregnancy: Secondary | ICD-10-CM

## 2023-11-01 DIAGNOSIS — Z114 Encounter for screening for human immunodeficiency virus [HIV]: Secondary | ICD-10-CM

## 2023-11-01 DIAGNOSIS — Z113 Encounter for screening for infections with a predominantly sexual mode of transmission: Secondary | ICD-10-CM

## 2023-11-01 DIAGNOSIS — Z131 Encounter for screening for diabetes mellitus: Secondary | ICD-10-CM | POA: Diagnosis not present

## 2023-11-01 DIAGNOSIS — Z3689 Encounter for other specified antenatal screening: Secondary | ICD-10-CM

## 2023-11-01 DIAGNOSIS — Z3A18 18 weeks gestation of pregnancy: Secondary | ICD-10-CM

## 2023-11-01 DIAGNOSIS — Z3480 Encounter for supervision of other normal pregnancy, unspecified trimester: Secondary | ICD-10-CM | POA: Diagnosis not present

## 2023-11-03 LAB — URINE CULTURE

## 2023-11-03 NOTE — Assessment & Plan Note (Signed)
 Red flag symptoms reviewed. Anticipatory guidance for upcoming 3rd trimester labs reviewed. Doing well on current dose of lexapro & propanolol for headache prevention.

## 2023-11-03 NOTE — Patient Instructions (Signed)
 Gestational Diabetes Mellitus, Diagnosis Gestational diabetes mellitus, or gestational diabetes, is diabetes that some people get when pregnant. This condition usually happens at 24-28 weeks of pregnancy.  People with gestational diabetes have high blood sugar. If you do not get treated for this condition, it may cause problems for you and your baby. It goes away after you give birth but can happen again the next time you become pregnant. What are the causes? This condition is caused by changes in your body when you're pregnant. When these changes happen: The pancreas may not make enough insulin. The body may not use insulin in the right way. This is called insulin resistance. Insulin helps your body use sugar for energy. If your body doesn't have enough insulin or can't use the insulin that it has, extra sugars stay in your blood. This leads to high blood sugar (hyperglycemia). What increases the risk? Being older than age 93 when pregnant. Too much body weight. Having polycystic ovary syndrome (PCOS). Having someone with diabetes in your family. Having had this condition in the past. Being pregnant with more than one baby. What are the signs or symptoms? You may not have any symptoms. If you do have symptoms, they may include: Being thirsty often. Being hungry often. Needing to pee more often. These symptoms can be missed because they're similar to other symptoms of pregnancy. How is this diagnosed? This condition may be diagnosed based on your blood sugar level and how your body responds to glucose. This may be checked with an oral glucose tolerance test (OGTT). The test may be done: Early in pregnancy if you have risk factors. At 24-28 weeks of your pregnancy. How is this treated? To treat this condition, you may be told to: Eat a healthy diet. Get more exercise. Check your blood sugar often. Your health care provider will tell you what your target is. Take insulin and other  medicines. These are taken if needed. Work with a diabetes expert. Follow these instructions at home: Learn about your diabetes Ask your provider: How often should I check my blood sugar? Where do I get the equipment? What medicines do I need? When should I take them? Do I need to meet with an educator? Who can I call if I have questions? Where can I find a support group? General instructions Take medicines only as told by your provider. Stay at a healthy weight. Drink enough fluid to keep your pee (urine) pale yellow. Check your pee for ketones when sick and as told. Ketones in your pee is a sign that your body is using fat for energy because it's not making enough insulin. Wear an alert bracelet or carry a card that shows you have this condition. Keep all follow-up visits. Your provider needs to check your health and your baby's growth. Where to find more information Gestational Diabetes: American Diabetes Association (ADA): diabetes.org Gestational Diabetes and Pregnancy: Centers for Disease Control and Prevention (CDC): TonerPromos.no American Pregnancy Association: americanpregnancy.org [place] MyPlate: WrestlingReporter.dk Contact a health care provider if:  Your blood sugar is above your target for two tests in a row. You have a high blood sugar level and you also have ketones in your pee. You have been sick or have had a fever for 2 days or more and aren't getting better. You have any of these problems for more than 6 hours: You can't eat or drink. You vomit or feel like you may vomit. You have watery poop (diarrhea). Get help right away if:  You become confused or cannot think clearly. You have trouble breathing. You have chest pain. Your baby is moving less than usual. You have unusual discharge or bleeding from your vagina. You have cramping in your belly or have pain in your hips or lower back. You have symptoms of high blood pressure or preeclampsia. These include: A severe,  throbbing headache that doesn't go away. Sudden or extreme swelling of your face, hands, legs, or feet. Vision problems, such as: Seeing spots. Blurry vision. Sensitivity to light. These symptoms may be an emergency. Get help right away. Call 911. Do not wait to see if the symptoms will go away. Do not drive yourself to the hospital. This information is not intended to replace advice given to you by your health care provider. Make sure you discuss any questions you have with your health care provider. Document Revised: 04/18/2023 Document Reviewed: 10/26/2022 Elsevier Patient Education  2024 Else[employer] Inc. Second Trimester of Pregnancy  The second trimester of pregnancy is from week 14 through week 27. This is months 4 through 6 of pregnancy. During the second trimester: Morning sickness is less or has stopped. You may have more energy. You may feel hungry more often. At this time, your unborn baby is growing very fast. At the end of the sixth month, the unborn baby may be up to 12 inches long and weigh about 1 pounds. You will likely start to feel the baby move between 16 and 20 weeks of pregnancy. Body changes during your second trimester Your body continues to change during this time. The changes usually go away after your baby is born. Physical changes You will gain more weight. Your belly will get bigger. You may begin to get stretch marks on your hips, belly, and breasts. Your breasts will keep growing and may hurt. You may get dark spots or blotches on your face. A dark line from your belly button to the pubic area may appear. This line is called linea nigra. Your hair may grow faster and get thicker. Health changes You may have headaches. You may have heartburn. You may pee more often. You may have swollen, bulging veins (varicose veins). You may have trouble pooping (constipation), or swollen veins in the butt that can itch or get painful (hemorrhoids). You may have back  pain. This is caused by: Weight gain. Pregnancy hormones that are relaxing the joints in your pelvis. Follow these instructions at home: Medicines Talk to your health care provider if you're taking medicines. Ask if the medicines are safe to take during pregnancy. Your provider may change the medicines that you take. Do not take any medicines unless told to by your provider. Take a prenatal vitamin that has at least 600 micrograms (mcg) of folic acid. Do not use herbal medicines, illegal drugs, or medicines that are not approved by your provider. Eating and drinking While you're pregnant your body needs extra food for your growing baby. Talk with your provider about what to eat while pregnant. Activity Most women are able to exercise during pregnancy. Exercises may need to change as your pregnancy goes on. Talk to your provider about your activities and exercise routines. Relieving pain and discomfort Wear a good, supportive bra if your breasts hurt. Rest with your legs raised if you have leg cramps or low back pain. Take warm sitz baths to soothe pain from hemorrhoids. Use hemorrhoid cream if your provider says it's okay. Do not douche. Do not use tampons or scented pads. Do not  use hot tubs, steam rooms, or saunas. Safety Wear your seatbelt at all times when you're in a car. Talk to your provider if someone hits you, hurts you, or yells at you. Talk with your provider if you're feeling sad or have thoughts of hurting yourself. Lifestyle Certain things can be harmful while you're pregnant. It's best to avoid the following: Do not drink alcohol,smoke, vape, or use products with nicotine or tobacco in them. If you need help quitting, talk with your provider. Avoid cat litter boxes and soil used by cats. These things carry germs that can cause harm to your pregnancy and your baby. General instructions Keep all follow-up visits. It helps you and your unborn baby stay as healthy as  possible. Write down your questions. Take them to your prenatal visits. Your provider will: Talk with you about your overall health. Give you advice or refer you to specialists who can help with different needs, including: Prenatal education classes. Mental health and counseling. Foods and healthy eating. Ask for help if you need help with food. Where to find more information American Pregnancy Association: americanpregnancy.org [employer]necologists: acog.org Office on [employer] Health: TravelLesson.ca Contact a health care provider if: You have a headache that does not go away when you take medicine. You have any of these problems: You can't eat or drink. You throw up or feel like you may throw up. You have watery poop (diarrhea) for 2 days or more. You have pain when you pee or your pee smells bad. You have been sick for 2 days or more and are not getting better. Contact your provider right away if: You have any of these coming from your vagina: Abnormal discharge. Bad-smelling fluid. Bleeding. Your baby is moving less than usual. You have contractions, belly cramping, or have pain in your pelvis or lower back. You have symptoms of high blood pressure or preeclampsia. These include: A severe, throbbing headache that does not go away. Sudden or extreme swelling of your face, hands, legs, or feet. Vision problems: You see spots. You have blurry vision. Your eyes are sensitive to light. If you can't reach the provider, go to an urgent care or emergency room. Get help right away if: You faint, become confused, or can't think clearly. You have chest pain or trouble breathing. You have any kind of injury, such as from a fall or a car crash. These symptoms may be an emergency. Call 911 right away. Do not wait to see if the symptoms will go away. Do not drive yourself to the hospital. This information is not intended to replace advice given to you by  your health care provider. Make sure you discuss any questions you have with your health care provider. Document Revised: 04/18/2023 Document Reviewed: 11/16/2022 Elsevier Patient Education  2024 [employer].

## 2023-11-03 NOTE — Progress Notes (Signed)
    Return Prenatal Note   Subjective   33 y.o. G2P1001 at [redacted]w[redacted]d presents for this follow-up prenatal visit.  Patient reports one episode of pink vaginal discharge, no frank bleeding, happened almost a week ago. Patient reports: Movement: Present Contractions: Not present  Objective   Flow sheet Vitals: Pulse Rate: 72 BP: 111/72 Fundal Height: 22 cm Fetal Heart Rate (bpm): 145 Total weight gain: 11.2 oz (0.318 kg)  General Appearance  No acute distress, well appearing, and well nourished Pulmonary   Normal work of breathing Neurologic   Alert and oriented to person, place, and time Psychiatric   Mood and affect within normal limits  Assessment/Plan   Plan  32 y.o. G2P1001 at [redacted]w[redacted]d presents for follow-up OB visit. Reviewed prenatal record including previous visit note.  Supervision of other normal pregnancy, antepartum Red flag symptoms reviewed. Anticipatory guidance for upcoming 3rd trimester labs reviewed. Doing well on current dose of lexapro & propanolol for headache prevention.      Orders Placed This Encounter  Procedures   Urine Culture   28 Week RH+Panel    Standing Status:   Future    Expected Date:   11/30/2023    Expiration Date:   10/30/2024   Return in 4 weeks (on 11/29/2023) for ROB & GDM screening.   Future Appointments  Date Time Provider Department Center  11/29/2023  9:20 AM [facility] None  11/29/2023  9:55 AM Free, Lindalou Hose, CNM [facility] None    For next visit:  ROB with 1 hour glucola, third trimester labs, and Tdap     Dominica Severin, CNM

## 2023-11-16 ENCOUNTER — Other Ambulatory Visit: Payer: Self-pay | Admitting: Certified Nurse Midwife

## 2023-11-16 DIAGNOSIS — Z369 Encounter for antenatal screening, unspecified: Secondary | ICD-10-CM

## 2023-11-16 DIAGNOSIS — O99891 Other specified diseases and conditions complicating pregnancy: Secondary | ICD-10-CM

## 2023-11-20 ENCOUNTER — Encounter: Payer: Self-pay | Admitting: Certified Nurse Midwife

## 2023-11-22 ENCOUNTER — Telehealth: Payer: Self-pay

## 2023-11-22 NOTE — Telephone Encounter (Signed)
 Spoke with patient. She is unable to tell if there is any color as her underwear are pink. She states it was a very small wet spot. She denies intercourse in past 24-48 hours, denies cramping. Advised increase vaginal discharge is normal in pregnancy. Reviewed when needs evaluation. Advised to place panty liner, wear for one hour, check for continued leaking. Call back with any additional questions or concerns/need for evaluation.

## 2023-11-22 NOTE — Telephone Encounter (Signed)
 TRIAGE VOICEMAIL: Patient states she is 25w. She just went to the restroom and found something on her underwear. It's not pee and not discharge, her water did not break, it's not sweat. She doesn't know if she's leaking something.

## 2023-11-29 ENCOUNTER — Ambulatory Visit (INDEPENDENT_AMBULATORY_CARE_PROVIDER_SITE_OTHER)

## 2023-11-29 ENCOUNTER — Other Ambulatory Visit

## 2023-11-29 ENCOUNTER — Encounter

## 2023-11-29 VITALS — BP 109/71 | HR 85 | Wt 178.0 lb

## 2023-11-29 DIAGNOSIS — Z113 Encounter for screening for infections with a predominantly sexual mode of transmission: Secondary | ICD-10-CM

## 2023-11-29 DIAGNOSIS — Z3480 Encounter for supervision of other normal pregnancy, unspecified trimester: Secondary | ICD-10-CM | POA: Diagnosis not present

## 2023-11-29 DIAGNOSIS — F419 Anxiety disorder, unspecified: Secondary | ICD-10-CM

## 2023-11-29 DIAGNOSIS — F32A Depression, unspecified: Secondary | ICD-10-CM

## 2023-11-29 DIAGNOSIS — Z3A26 26 weeks gestation of pregnancy: Secondary | ICD-10-CM | POA: Diagnosis not present

## 2023-11-29 DIAGNOSIS — Z114 Encounter for screening for human immunodeficiency virus [HIV]: Secondary | ICD-10-CM

## 2023-11-29 DIAGNOSIS — Z131 Encounter for screening for diabetes mellitus: Secondary | ICD-10-CM

## 2023-11-29 NOTE — Assessment & Plan Note (Signed)
-   Discussed possible need to increase SSRI dose in third trimester. Feels stable at the moment.

## 2023-11-29 NOTE — Assessment & Plan Note (Addendum)
-   Gestational diabetes screening, third trimester labs, and BTFC completed today. Desires Tdap at next visit.  - Reviewed comfort measures for swelling in pregnancy.  - Reviewed kick counts and preterm labor warning signs. Instructed to call office or come to hospital with persistent headache, vision changes, regular contractions, leaking of fluid, decreased fetal movement or vaginal bleeding.

## 2023-11-29 NOTE — Progress Notes (Signed)
    Return Prenatal Note   Assessment/Plan   Plan  33 y.o. G2P1001 at [redacted]w[redacted]d presents for follow-up OB visit. Reviewed prenatal record including previous visit note.  Supervision of other normal pregnancy, antepartum - Gestational diabetes screening, third trimester labs, and BTFC completed today. Desires Tdap at next visit.  - Reviewed comfort measures for swelling in pregnancy.  - Reviewed kick counts and preterm labor warning signs. Instructed to call office or come to hospital with persistent headache, vision changes, regular contractions, leaking of fluid, decreased fetal movement or vaginal bleeding.  Anxiety and depression - Discussed possible need to increase SSRI dose in third trimester. Feels stable at the moment.    No orders of the defined types were placed in this encounter.  Return in about 2 weeks (around 12/13/2023) for ROB.   Future Appointments  Date Time Provider Department Center  12/13/2023  1:55 PM Alise Appl, CNM [facility] None    For next visit:  continue with routine prenatal care     Subjective   33 y.o. G2P1001 at [redacted]w[redacted]d presents for this follow-up prenatal visit.  Patient noticed some increased swelling and pelvic discomfort recently.  Patient reports: Movement: Present Contractions: Not present  Objective   Flow sheet Vitals: Pulse Rate: 85 BP: 109/71 Fundal Height: 26 cm Fetal Heart Rate (bpm): 145 Total weight gain: 13 lb (5.897 kg)  General Appearance  No acute distress, well appearing, and well nourished Pulmonary   Normal work of breathing Neurologic   Alert and oriented to person, place, and time Psychiatric   Mood and affect within normal limits  Toni Foster, CNM  05/02/253:35 PM

## 2023-11-30 LAB — 28 WEEK RH+PANEL
Basophils Absolute: 0 10*3/uL (ref 0.0–0.2)
Basos: 0 %
EOS (ABSOLUTE): 0.5 10*3/uL — ABNORMAL HIGH (ref 0.0–0.4)
Eos: 4 %
Gestational Diabetes Screen: 155 mg/dL — ABNORMAL HIGH (ref 70–139)
HIV Screen 4th Generation wRfx: NONREACTIVE
Hematocrit: 31.6 % — ABNORMAL LOW (ref 34.0–46.6)
Hemoglobin: 10.6 g/dL — ABNORMAL LOW (ref 11.1–15.9)
Immature Grans (Abs): 0.1 10*3/uL (ref 0.0–0.1)
Immature Granulocytes: 1 %
Lymphocytes Absolute: 2.6 10*3/uL (ref 0.7–3.1)
Lymphs: 20 %
MCH: 32.1 pg (ref 26.6–33.0)
MCHC: 33.5 g/dL (ref 31.5–35.7)
MCV: 96 fL (ref 79–97)
Monocytes Absolute: 0.5 10*3/uL (ref 0.1–0.9)
Monocytes: 4 %
Neutrophils Absolute: 9.1 10*3/uL — ABNORMAL HIGH (ref 1.4–7.0)
Neutrophils: 71 %
Platelets: 311 10*3/uL (ref 150–450)
RBC: 3.3 x10E6/uL — ABNORMAL LOW (ref 3.77–5.28)
RDW: 12.5 % (ref 11.7–15.4)
RPR Ser Ql: NONREACTIVE
WBC: 12.8 10*3/uL — ABNORMAL HIGH (ref 3.4–10.8)

## 2023-12-02 ENCOUNTER — Encounter: Payer: Self-pay | Admitting: Certified Nurse Midwife

## 2023-12-02 ENCOUNTER — Other Ambulatory Visit: Payer: Self-pay | Admitting: Certified Nurse Midwife

## 2023-12-02 DIAGNOSIS — O99012 Anemia complicating pregnancy, second trimester: Secondary | ICD-10-CM

## 2023-12-02 DIAGNOSIS — Z131 Encounter for screening for diabetes mellitus: Secondary | ICD-10-CM

## 2023-12-02 DIAGNOSIS — O99810 Abnormal glucose complicating pregnancy: Secondary | ICD-10-CM

## 2023-12-05 ENCOUNTER — Other Ambulatory Visit

## 2023-12-05 DIAGNOSIS — O99810 Abnormal glucose complicating pregnancy: Secondary | ICD-10-CM

## 2023-12-05 DIAGNOSIS — O99012 Anemia complicating pregnancy, second trimester: Secondary | ICD-10-CM

## 2023-12-05 DIAGNOSIS — Z131 Encounter for screening for diabetes mellitus: Secondary | ICD-10-CM

## 2023-12-06 ENCOUNTER — Encounter: Payer: Self-pay | Admitting: Certified Nurse Midwife

## 2023-12-06 LAB — GESTATIONAL GLUCOSE TOLERANCE
Glucose, Fasting: 75 mg/dL (ref 70–94)
Glucose, GTT - 1 Hour: 146 mg/dL (ref 70–179)
Glucose, GTT - 2 Hour: 125 mg/dL (ref 70–154)
Glucose, GTT - 3 Hour: 113 mg/dL (ref 70–139)

## 2023-12-06 LAB — FERRITIN: Ferritin: 38 ng/mL (ref 15–150)

## 2023-12-13 ENCOUNTER — Ambulatory Visit: Admitting: Certified Nurse Midwife

## 2023-12-13 ENCOUNTER — Encounter: Payer: Self-pay | Admitting: Certified Nurse Midwife

## 2023-12-13 VITALS — BP 108/72 | HR 74 | Wt 182.7 lb

## 2023-12-13 DIAGNOSIS — Z3A28 28 weeks gestation of pregnancy: Secondary | ICD-10-CM | POA: Diagnosis not present

## 2023-12-13 DIAGNOSIS — Z23 Encounter for immunization: Secondary | ICD-10-CM

## 2023-12-13 DIAGNOSIS — Z3483 Encounter for supervision of other normal pregnancy, third trimester: Secondary | ICD-10-CM | POA: Diagnosis not present

## 2023-12-13 NOTE — Patient Instructions (Signed)
 Early Labor (Pre-Term Labor): What to Know Pregnancy normally lasts 39-41 weeks. Pre-term labor is when labor starts before you have been pregnant for 37 weeks. Babies who are born too early may have a higher risk for long-term problems like cerebral palsy or developmental delays. Premature babies may also have problems soon after birth, such as problems with their blood sugar, body temperature, heart, and breathing. These babies often have trouble with feeding.These problems may be very serious in babies who are born before 34 weeks of pregnancy. What are the causes? The cause of pre-term labor is not known. What increases the risk? You're more likely to have pre-term labor if: You have medical problems, now or in the past. You have problems now or in your past pregnancies. You have risk factors related to lifestyle, environment, and age. Medical history You have problems affecting your uterus, including a short cervix. You have a sexually transmitted infections (STI) or other infections of the urinary tract and the vagina. You have: Blood clotting problems. High blood pressure. High blood sugar. You have a low body weight or too much body weight. Present and past pregnancies You have had pre-term labor before. You're pregnant with more than one baby. The placenta covers your cervix,. This is called placenta previa. Your unborn baby has a congenital condition. This means your baby will be born with the condition. You have bleeding from your vagina while you're pregnant. You became pregnant by a method called IVF. Lifestyle and environmental factors You smoke. You drink alcohol. You use drugs. You have stress and no social support. You have abuse in your home. You're exposed to certain chemicals at home or work. Other factors You're younger than age 63 or older than age 20. What are the signs or symptoms? Cramps like those that can happen during a menstrual period. The cramps may  happen with diarrhea, which is watery poop. Pain in the belly. Pain in the lower back. Regular contractions. It may feel like your belly is getting tighter. Pressure in your pelvis. More fluid leaking from the vagina. The fluid may be watery or bloody. Your water breaking. How is this treated? Treatment depends on your health, the health of your baby, and how far along your pregnancy is. It may include: Taking: Medicines to stop contractions. Medicines to help the baby's lungs mature. Medicines to help prevent your baby from having cerebral palsy or other problems. Bed rest. If the labor happens before 34 weeks of pregnancy, you may need to stay in the hospital. Delivering the baby. Follow these instructions at home: Do not smoke, vape, or use nicotine or tobacco. Do not drink alcohol. Take your medicines only as told. Rest as told by your doctor. Ask what things are safe for you to do at home. Ask when you can go back to work or school. Keep all follow-up visits. Your provider will need to closely follow your health and the health of your baby. How is this prevented? To increase your chance of having a full-term pregnancy: Do not use drugs. Do not use any medicines unless you ask your doctor if they are safe for you. Talk with your doctor before taking any herbal supplements. Gain a healthy amount of weight during your pregnancy. Watch for infection. If you think you might have an infection, get it checked right away. Symptoms of infection may include: Fever. Discharge from your vagina that smells bad or is not normal. Pain or burning when you pee. Having to pee  small amounts or very often. Blood in your pee. Where to find more information To learn more, go to these websites: U.S. Department of Health and Cytogeneticist on Women's Health: http://hoffman.com/ [employer] of Obstetricians and Gynecologists: www.acog.org Centers for Disease Control and Prevention  at DiningCalendar.de. Then: Click "Search" and type "preterm labor." Find the link you need. Contact a doctor if: You think you're going into pre-term labor. You have symptoms of pre-term labor. You have symptoms of infection. Get help right away if: You're having painful contractions every 5 minutes or less. Your water breaks. This information is not intended to replace advice given to you by your health care provider. Make sure you discuss any questions you have with your health care provider. Document Revised: 01/24/2023 Document Reviewed: 01/24/2023 Elsevier Patient Education  2024 [employer].

## 2023-12-13 NOTE — Progress Notes (Signed)
 ROB doing well. PT has already had glucose testing. Discussed birth control pt plans on BTL inpatient with c/s ( should she require it). If not will have outpatient interval BTL after delivery. BTL consent signed today just in case she has c/section. Sample birth plan given. Will review with her next visit. Denies concerns today. Follow up 2 weeks.   Alise Appl, CNM

## 2023-12-17 ENCOUNTER — Telehealth: Payer: Self-pay

## 2023-12-17 NOTE — Telephone Encounter (Signed)
 Patient called with concerns about headaches that are severe. She has a history of migraines, she has been taking 500 mg Tylenol  with no relief. She also has some nausea. Headache is in the middle of her head. Her BP is good 123/86. I advised patient to continue taking Tylenol  for relief, stay well hydrated, cool compresses and try drinking a decaffeinated drink to see if this will help. If this does not relieve her symptoms and they get worse, please call and schedule an appointment. She verbalized understanding. Please advise.

## 2023-12-27 ENCOUNTER — Ambulatory Visit: Admitting: Certified Nurse Midwife

## 2023-12-27 VITALS — BP 106/53 | HR 78 | Wt 187.7 lb

## 2023-12-27 DIAGNOSIS — O26843 Uterine size-date discrepancy, third trimester: Secondary | ICD-10-CM

## 2023-12-27 DIAGNOSIS — Z3A30 30 weeks gestation of pregnancy: Secondary | ICD-10-CM

## 2023-12-27 DIAGNOSIS — Z3480 Encounter for supervision of other normal pregnancy, unspecified trimester: Secondary | ICD-10-CM

## 2023-12-27 DIAGNOSIS — O26849 Uterine size-date discrepancy, unspecified trimester: Secondary | ICD-10-CM | POA: Insufficient documentation

## 2023-12-27 NOTE — Assessment & Plan Note (Signed)
 Having some pelvic and round ligament pain. Using KT tape. Will try pregnancy belt to see if that helps more. Traveling to [place] in two weeks. Precautions given. Reviewed kick counts and preterm labor warning signs. Instructed to call office or come to hospital with persistent headache, vision changes, regular contractions, leaking of fluid, decreased fetal movement or vaginal bleeding.

## 2023-12-27 NOTE — Progress Notes (Signed)
    Return Prenatal Note   Assessment/Plan   Plan  33 y.o. G2P1001 at [redacted]w[redacted]d presents for follow-up OB visit. Reviewed prenatal record including previous visit note.  Uterine size date discrepancy Measuring 34cm at 30 weeks. Leopolds does not feel like a large baby. Patient also endorsing more discomfort right under ribs. Will order US  to assess for growth and AFI  Supervision of other normal pregnancy, antepartum Having some pelvic and round ligament pain. Using KT tape. Will try pregnancy belt to see if that helps more. Traveling to [place] in two weeks. Precautions given. Reviewed kick counts and preterm labor warning signs. Instructed to call office or come to hospital with persistent headache, vision changes, regular contractions, leaking of fluid, decreased fetal movement or vaginal bleeding.    Orders Placed This Encounter  Procedures   US  OB Follow Up    Standing Status:   Future    Expected Date:   12/30/2023    Expiration Date:   03/28/2024    Reason for exam::   Growth and AFI for size greater than dates    Preferred imaging location?:   [facility]   No follow-ups on file.   Future Appointments  Date Time Provider Department Center  01/13/2024  8:15 AM Dominic, Alva Jewels, CNM [facility] None    For next visit:  continue with routine prenatal care     Subjective   32 y.o. G2P1001 at [redacted]w[redacted]d presents for this follow-up prenatal visit.  Patient has concerns about round ligament and rib pain.  Patient reports: Movement: Present Contractions: Not present  Objective   Flow sheet Vitals: Pulse Rate: 78 BP: (!) 106/53 Fundal Height: 34 cm Fetal Heart Rate (bpm): 145 Total weight gain: 22 lb 11.2 oz (10.3 kg)  General Appearance  No acute distress, well appearing, and well nourished Pulmonary   Normal work of breathing Neurologic   Alert and oriented to person, place, and time Psychiatric   Mood and affect within normal limits  Donato Fu,  CNM  05/30/252:52 PM

## 2023-12-27 NOTE — Assessment & Plan Note (Signed)
 Measuring 34cm at 30 weeks. Leopolds does not feel like a large baby. Patient also endorsing more discomfort right under ribs. Will order US  to assess for growth and AFI

## 2023-12-31 ENCOUNTER — Encounter: Payer: Self-pay | Admitting: Certified Nurse Midwife

## 2024-01-01 ENCOUNTER — Observation Stay
Admission: EM | Admit: 2024-01-01 | Discharge: 2024-01-01 | Disposition: A | Discharge status: Home / Self Care | Attending: Obstetrics and Gynecology | Admitting: Obstetrics and Gynecology

## 2024-01-01 DIAGNOSIS — Z3480 Encounter for supervision of other normal pregnancy, unspecified trimester: Principal | ICD-10-CM

## 2024-01-01 DIAGNOSIS — O1203 Gestational edema, third trimester: Secondary | ICD-10-CM | POA: Insufficient documentation

## 2024-01-01 DIAGNOSIS — O99891 Other specified diseases and conditions complicating pregnancy: Secondary | ICD-10-CM

## 2024-01-01 DIAGNOSIS — Z3A30 30 weeks gestation of pregnancy: Secondary | ICD-10-CM | POA: Diagnosis not present

## 2024-01-01 DIAGNOSIS — O368130 Decreased fetal movements, third trimester, not applicable or unspecified: Secondary | ICD-10-CM | POA: Diagnosis present

## 2024-01-01 MED ORDER — CALCIUM CARBONATE ANTACID 500 MG PO CHEW
400.0000 mg | CHEWABLE_TABLET | Freq: Once | ORAL | Status: AC
  Administered 2024-01-01: 400 mg via ORAL
  Filled 2024-01-01: qty 2

## 2024-01-01 MED ORDER — CALCIUM CARBONATE ANTACID 500 MG PO CHEW: 400.0000 mg | CHEWABLE_TABLET | Freq: Three times a day (TID) | ORAL | Status: DC

## 2024-01-01 NOTE — OB Triage Note (Signed)
 Pt Toni Foster 33 y.o. presents to labor and delivery triage reporting  New swelling in lower legs, and decreased fetal movement . Pt is a G2P1001 at [redacted]w[redacted]d . Pt denies signs and symptons consistent with rupture of membranes or active vaginal bleeding. Pt denies contractions. External FM and TOCO applied to non-tender abdomen and assessing. Initial FHR 145. Vital signs obtained and within normal limits. Provider notified of pt.

## 2024-01-01 NOTE — OB Triage Note (Signed)
 Discharge instructions, labor precautions, and follow-up care reviewed with patient and significant other. All questions answered. Patient verbalized understanding. Discharged ambulatory off unit.

## 2024-01-01 NOTE — Discharge Summary (Signed)
 Physician Final Progress Note  Patient ID: Toni Foster MRN: [id number] DOB/AGE: 10/09/1990 33 y.o.  Admit date: 01/01/2024 Admitting provider: Raynell Caller, MD Discharge date: 01/01/2024   Admission Diagnoses:  1) intrauterine pregnancy at [redacted]w[redacted]d  2) swelling  3) Decreased fetal movement   Discharge Diagnoses:  Principal Problem:   Indication for care in labor and delivery, antepartum    History of Present Illness: The patient is a 33 y.o. female G2P1001 at [redacted]w[redacted]d who presents for swelling and decreased fetal movement.  Toni Foster was at work today and noticed her feet were swollen, she checked her BP it was 128/82 so she called the answering service, when asked about fetal movement she realized she has not felt the fetus move in a number of hours. Toni Foster now feels some movement. Toni Foster has a hx of headaches/migarines which are treated with Propanolol. Denies any current headache, visual disturbances or RUQ pain. Denies contractions, LOF/VB. Her sister had preeclampsia which requires her to give birth early, Toni Foster is concerned for preeclampsia.   Past Medical History:  Diagnosis Date   Medical history non-contributory     Past Surgical History:  Procedure Laterality Date   ANTERIOR CRUCIATE LIGAMENT REPAIR     TONSILLECTOMY     WISDOM TOOTH EXTRACTION      No current facility-administered medications on file prior to encounter.   Current Outpatient Medications on File Prior to Encounter  Medication Sig Dispense Refill   acetaminophen  (TYLENOL ) 500 MG tablet Take 2 tablets (1,000 mg total) by mouth every 6 (six) hours as needed (for pain scale < 4). 30 tablet 0   cetirizine (ZYRTEC) 10 MG chewable tablet Chew 10 mg by mouth daily.     escitalopram  (LEXAPRO ) 10 MG tablet Take 1 tablet (10 mg total) by mouth daily. 90 tablet 1   ondansetron  (ZOFRAN -ODT) 4 MG disintegrating tablet Take 1 tablet (4 mg total) by mouth every 8 (eight) hours as needed for nausea or vomiting. 30 tablet 1    Prenatal Vit-Fe Fumarate-FA (MULTIVITAMIN-PRENATAL) 27-0.8 MG TABS tablet Take 1 tablet by mouth at bedtime. 90 tablet 3   promethazine  (PHENERGAN ) 12.5 MG suppository Place 1 suppository (12.5 mg total) rectally every 6 (six) hours as needed for nausea or vomiting. 12 each 2   propranolol  ER (INDERAL  LA) 60 MG 24 hr capsule Take 1 capsule (60 mg total) by mouth daily. 90 capsule 2   cefadroxil  (DURICEF) 500 MG capsule Take 1 capsule (500 mg total) by mouth 2 (two) times daily. 14 capsule 0    Allergies  Allergen Reactions   Wheat Other (See Comments)    Social History   Socioeconomic History   Marital status: Single    Spouse name: Not on file   Number of children: 1   Years of education: 84   Highest education level: Some college, no degree  Occupational History   Not on file  Tobacco Use   Smoking status: Never   Smokeless tobacco: Never  Vaping Use   Vaping status: Never Used  Substance and Sexual Activity   Alcohol use: No   Drug use: Never   Sexual activity: Yes    Birth control/protection: None  Other Topics Concern   Not on file  Social History Narrative   Not on file   Social Drivers of Health   Financial Resource Strain: Low Risk  (08/26/2023)   Overall Financial Resource Strain (CARDIA)    Difficulty of Paying Living Expenses: Not very hard  Food Insecurity: No  Food Insecurity (08/26/2023)   Hunger Vital Sign    Worried About Running Out of Food in the Last Year: Never true    Ran Out of Food in the Last Year: Never true  Transportation Needs: No Transportation Needs (08/26/2023)   PRAPARE - Administrator, Civil Service (Medical): No    Lack of Transportation (Non-Medical): No  Physical Activity: Inactive (08/26/2023)   Exercise Vital Sign    Days of Exercise per Week: 0 days    Minutes of Exercise per Session: 0 min  Stress: Stress Concern Present (08/26/2023)   [employer] of Occupational Health - Occupational Stress Questionnaire     Feeling of Stress : To some extent  Social Connections: Moderately Isolated (08/26/2023)   Social Connection and Isolation Panel [NHANES]    Frequency of Communication with Friends and Family: More than three times a week    Frequency of Social Gatherings with Friends and Family: Twice a week    Attends Religious Services: Never    Database administrator or Organizations: No    Attends Banker Meetings: Never    Marital Status: Living with partner  Intimate Partner Violence: Not At Risk (08/26/2023)   Humiliation, Afraid, Rape, and Kick questionnaire    Fear of Current or Ex-Partner: No    Emotionally Abused: No    Physically Abused: No    Sexually Abused: No    Family History  Problem Relation Age of Onset   Fibromyalgia Mother    Depression Mother    Anxiety disorder Mother    Anxiety disorder Father    Depression Father    Migraines Father    Anxiety disorder Sister    Depression Sister    Healthy Sister    Anxiety disorder Brother    Depression Brother    Healthy Brother    Breast cancer Maternal Grandmother 40   Parkinson's disease Maternal Grandfather    Alzheimer's disease Paternal Grandmother    Alzheimer's disease Paternal Grandfather      ROS  see HPI   Physical Exam: BP (!) 113/59   Pulse 73   Temp 98.4 F (36.9 C) (Oral)   Resp 16   LMP 05/30/2023   Physical Exam Constitutional:      Appearance: Normal appearance.  Cardiovascular:     Rate and Rhythm: Normal rate and regular rhythm.     Pulses: Normal pulses.     Heart sounds: Normal heart sounds.  Pulmonary:     Effort: Pulmonary effort is normal.     Breath sounds: Normal breath sounds.  Abdominal:     Tenderness: There is no abdominal tenderness.     Comments: gravid  Musculoskeletal:     Cervical back: Normal range of motion.     Comments: Trace to +1 BLE   Neurological:     General: No focal deficit present.     Mental Status: She is alert.     Deep Tendon Reflexes: Reflexes  normal.  Skin:    General: Skin is warm.  Psychiatric:        Mood and Affect: Mood normal.        Thought Content: Thought content normal.   IA LEEB [birth year] [redacted]w[redacted]d  Fetus A Non-Stress Test Interpretation for 01/01/24  Indication: triage visit   Fetal Heart Rate A Mode: External Baseline Rate (A): 135 bpm Variability: Moderate Accelerations: 10 x 10 Decelerations: None Contractions q 2-3 mild  Consults: None  Significant Findings/ Diagnostic Studies: NA  Procedures: RNST   Hospital Course: The patient was admitted to Labor and Delivery Triage for observation. She had a RNST. Contractions were noted on the monitor however pt denied feeling contractions and appeared calm and not in pain. Reviewed contractions can often be seen on the monitor on a pregnant person in third trimester. Reviewed swelling as a normal third trimester discomfort-comfort measures reviewed. Reviewed sxs of preeclampsia and when call or be seen. Pt to keep scheduled US  for tomorrow.    Discharge Condition: stable  Disposition: Discharge disposition: 01-Home or Self Care       Diet: Regular diet  Discharge Activity: Activity as tolerated   Allergies as of 01/01/2024       Reactions   Wheat Other (See Comments)        Medication List     TAKE these medications    acetaminophen  500 MG tablet Commonly known as: TYLENOL  Take 2 tablets (1,000 mg total) by mouth every 6 (six) hours as needed (for pain scale < 4).   cefadroxil  500 MG capsule Commonly known as: DURICEF Take 1 capsule (500 mg total) by mouth 2 (two) times daily.   cetirizine 10 MG chewable tablet Commonly known as: ZYRTEC Chew 10 mg by mouth daily.   escitalopram  10 MG tablet Commonly known as: LEXAPRO  Take 1 tablet (10 mg total) by mouth daily.   multivitamin-prenatal 27-0.8 MG Tabs tablet Take 1 tablet by mouth at bedtime.   ondansetron  4 MG disintegrating tablet Commonly known as:  ZOFRAN -ODT Take 1 tablet (4 mg total) by mouth every 8 (eight) hours as needed for nausea or vomiting.   promethazine  12.5 MG suppository Commonly known as: PHENERGAN  Place 1 suppository (12.5 mg total) rectally every 6 (six) hours as needed for nausea or vomiting.   propranolol  ER 60 MG 24 hr capsule Commonly known as: INDERAL  LA Take 1 capsule (60 mg total) by mouth daily.         Total time spent taking care of this patient: 20 minutes  Signed: Berkley Breech [facility], CNM  01/01/2024, 9:11 PM

## 2024-01-02 ENCOUNTER — Ambulatory Visit
Admission: RE | Admit: 2024-01-02 | Discharge: 2024-01-02 | Disposition: A | Discharge status: Home / Self Care | Source: Ambulatory Visit | Attending: Certified Nurse Midwife | Admitting: Certified Nurse Midwife

## 2024-01-02 DIAGNOSIS — Z3480 Encounter for supervision of other normal pregnancy, unspecified trimester: Secondary | ICD-10-CM | POA: Insufficient documentation

## 2024-01-02 DIAGNOSIS — O26843 Uterine size-date discrepancy, third trimester: Secondary | ICD-10-CM | POA: Insufficient documentation

## 2024-01-02 DIAGNOSIS — Z3A32 32 weeks gestation of pregnancy: Secondary | ICD-10-CM | POA: Insufficient documentation

## 2024-01-13 ENCOUNTER — Encounter: Payer: Self-pay | Admitting: Licensed Practical Nurse

## 2024-01-13 ENCOUNTER — Ambulatory Visit (INDEPENDENT_AMBULATORY_CARE_PROVIDER_SITE_OTHER): Admitting: Licensed Practical Nurse

## 2024-01-13 VITALS — BP 116/75 | HR 83 | Wt 189.8 lb

## 2024-01-13 DIAGNOSIS — Z3480 Encounter for supervision of other normal pregnancy, unspecified trimester: Secondary | ICD-10-CM | POA: Diagnosis not present

## 2024-01-13 DIAGNOSIS — Z3483 Encounter for supervision of other normal pregnancy, third trimester: Secondary | ICD-10-CM | POA: Diagnosis not present

## 2024-01-13 DIAGNOSIS — Z3A32 32 weeks gestation of pregnancy: Secondary | ICD-10-CM | POA: Diagnosis not present

## 2024-01-13 NOTE — Progress Notes (Signed)
    Return Prenatal Note   Subjective   33 y.o. G2P1001 at [redacted]w[redacted]d presents for this follow-up prenatal visit.  Patient doing well. Questions about growth ultrasound.  Patient reports: recent hospital visit for DFM and abnormal swelling.  Movement: Present Contractions: Not present  Objective   Flow sheet Vitals: Pulse Rate: 83 BP: 116/75 Fundal Height: 33 cm Fetal Heart Rate (bpm): 140 Total weight gain: 11.2 kg  General Appearance  No acute distress, well appearing, and well nourished Pulmonary   Normal work of breathing Neurologic   Alert and oriented to person, place, and time Psychiatric   Mood and affect within normal limits  Assessment/Plan   Plan  33 y.o. G2P1001 at [redacted]w[redacted]d presents for follow-up OB visit. Reviewed prenatal record including previous visit note.  Supervision of other normal pregnancy, antepartum -Discussed and reviewed growth ultrasound. Normal AFI and EFW 76%tile -Recent hospital visit for abnormal ankle swelling and DFM. Reviewed kick counts. Sedentary at work, recommended walking laps at work when able and compression socks.  - birth plan: epidural  - PTL precautions and warning signs reviewed.  -Planning 6 to 8 weeks postpartum leave.  -R/O in 2 weeks.       No orders of the defined types were placed in this encounter.  Return in about 2 weeks (around 01/27/2024) for ROB.   Future Appointments  Date Time Provider Department Center  01/30/2024  8:55 AM Luster Salters Gretel Leaven, MD [facility] None    For next visit:  continue with routine prenatal care     Micaila Ziemba Montero-Diaz, Student-MidWife  06/16/258:50 AM

## 2024-01-13 NOTE — Assessment & Plan Note (Signed)
-  Discussed and reviewed growth ultrasound. Normal AFI and EFW 76%tile -Recent hospital visit for abnormal ankle swelling and DFM. Reviewed kick counts. Sedentary at work, recommended walking laps at work when able and compression socks.  - birth plan: epidural  - PTL precautions and warning signs reviewed.  -Planning 6 to 8 weeks postpartum leave.  -R/O in 2 weeks.

## 2024-01-24 ENCOUNTER — Encounter: Payer: Self-pay | Admitting: Licensed Practical Nurse

## 2024-01-29 ENCOUNTER — Telehealth: Payer: Self-pay

## 2024-01-29 NOTE — Telephone Encounter (Signed)
 TRIAGE VOICEMAIL: Patient states she received a notification on her apple watch yesterday that her average heart rate has been trending higher than usually for the past six days. She is inquiring if this is normal or anything she needs to be mindful of.

## 2024-01-29 NOTE — Telephone Encounter (Signed)
 Spoke with patient. Advised it is normal in pregnancy as there is increased blood volume/flow. Also advised it may be due to the extreme heat over the past week. Advised to remain well hydrated and not to be concerned if she isn't having symptoms (heart palpitations, chest pain, sob). She can discuss at [facility] visit tomorrow if she has additional questions or concerns.

## 2024-01-30 ENCOUNTER — Encounter: Payer: Self-pay | Admitting: Obstetrics and Gynecology

## 2024-01-30 ENCOUNTER — Ambulatory Visit (INDEPENDENT_AMBULATORY_CARE_PROVIDER_SITE_OTHER): Admitting: Obstetrics and Gynecology

## 2024-01-30 VITALS — BP 116/80 | HR 89 | Wt 193.6 lb

## 2024-01-30 DIAGNOSIS — Z3483 Encounter for supervision of other normal pregnancy, third trimester: Secondary | ICD-10-CM

## 2024-01-30 DIAGNOSIS — Z3A35 35 weeks gestation of pregnancy: Secondary | ICD-10-CM | POA: Diagnosis not present

## 2024-01-30 NOTE — Progress Notes (Signed)
 ROB:  EGA = 35.0.  Complains of daily heartburn.  Has tried Tums without success.  Prilosec OTC discussed.  She will try this before the next visit.  Reports daily fetal movement.  Plan cultures next visit.  Patient informed.

## 2024-02-04 ENCOUNTER — Ambulatory Visit (INDEPENDENT_AMBULATORY_CARE_PROVIDER_SITE_OTHER): Admitting: Obstetrics

## 2024-02-04 DIAGNOSIS — Z3480 Encounter for supervision of other normal pregnancy, unspecified trimester: Secondary | ICD-10-CM | POA: Diagnosis not present

## 2024-02-04 NOTE — Progress Notes (Signed)
    Return Prenatal Note   Assessment/Plan   Plan  33 y.o. G2P1001 at [redacted]w[redacted]d presents for follow-up OB visit. Reviewed prenatal record including previous visit note.  Supervision of other normal pregnancy, antepartum -Anticipatory guidance reviewed  - GBS swab at next visit.  - Discussed utilizing pillows prop herself at an angle to help with regurgitation at night, along with rib pain when laying on her side.  - Reviewed kick counts and preterm labor warning signs. Instructed to call office or come to hospital with persistent headache, vision changes, regular contractions, leaking of fluid, decreased fetal movement or vaginal bleeding.      No orders of the defined types were placed in this encounter.  No follow-ups on file.   Future Appointments  Date Time Provider Department Center  02/11/2024  3:15 PM Janit Alm Agent, MD [facility] None     For next visit:  Routine prenatal care    Subjective  Prisha has been feeling well overall, she has had rib pain for the last few weeks especially when laying on her side. She also reports having a couple episodes of regurgitation in the middle of the night.   Movement: Present Contractions: Not present  Objective   Flow sheet Vitals: Pulse Rate: (!) 102 BP: 121/78 Fundal Height: 36 cm Fetal Heart Rate (bpm): 143 Total weight gain: 11.8 kg  General Appearance  No acute distress, well appearing, and well nourished Pulmonary   Normal work of breathing Neurologic   Alert and oriented to person, place, and time Psychiatric   Mood and affect within normal limits  Rosina Hamilton, [facility] 02/04/24 4:35 PM

## 2024-02-04 NOTE — Assessment & Plan Note (Addendum)
-  Anticipatory guidance reviewed  - GBS swab at next visit.  - Discussed utilizing pillows prop herself at an angle to help with regurgitation at night, along with rib pain when laying on her side.  - Reviewed kick counts and preterm labor warning signs. Instructed to call office or come to hospital with persistent headache, vision changes, regular contractions, leaking of fluid, decreased fetal movement or vaginal bleeding.

## 2024-02-11 ENCOUNTER — Ambulatory Visit (INDEPENDENT_AMBULATORY_CARE_PROVIDER_SITE_OTHER): Admitting: Obstetrics and Gynecology

## 2024-02-11 ENCOUNTER — Other Ambulatory Visit (HOSPITAL_COMMUNITY)
Admission: RE | Admit: 2024-02-11 | Discharge: 2024-02-11 | Disposition: A | Discharge status: Home / Self Care | Source: Ambulatory Visit | Attending: Obstetrics and Gynecology | Admitting: Obstetrics and Gynecology

## 2024-02-11 ENCOUNTER — Encounter: Payer: Self-pay | Admitting: Obstetrics and Gynecology

## 2024-02-11 VITALS — BP 108/74 | HR 121 | Wt 195.0 lb

## 2024-02-11 DIAGNOSIS — Z113 Encounter for screening for infections with a predominantly sexual mode of transmission: Secondary | ICD-10-CM | POA: Insufficient documentation

## 2024-02-11 DIAGNOSIS — Z3685 Encounter for antenatal screening for Streptococcus B: Secondary | ICD-10-CM

## 2024-02-11 DIAGNOSIS — Z3480 Encounter for supervision of other normal pregnancy, unspecified trimester: Secondary | ICD-10-CM

## 2024-02-11 DIAGNOSIS — Z3A36 36 weeks gestation of pregnancy: Secondary | ICD-10-CM

## 2024-02-11 NOTE — Progress Notes (Signed)
 ROB:  EGA = 36.5.  She report she is generally doing well.  She is taking her Lexapro  and had a very bad day last week but she says this is generally not the case.  She does report daily fetal movement.  Cultures performed today for GC/CT-GBS.  Ultrasound confirmed vertex presentation.

## 2024-02-11 NOTE — Progress Notes (Signed)
 ROB. Patient states daily fetal movement, possible contractions over the weekend but have since resolved. GC/CT and GBS cultures ordered. Patient states no questions or concerns at this time.

## 2024-02-12 LAB — CERVICOVAGINAL ANCILLARY ONLY
Chlamydia: NEGATIVE
Comment: NEGATIVE
Comment: NORMAL
Neisseria Gonorrhea: NEGATIVE

## 2024-02-13 ENCOUNTER — Other Ambulatory Visit: Payer: Self-pay

## 2024-02-13 ENCOUNTER — Observation Stay
Admission: EM | Admit: 2024-02-13 | Discharge: 2024-02-14 | Disposition: A | Discharge status: Home / Self Care | Attending: Emergency Medicine | Admitting: Emergency Medicine

## 2024-02-13 ENCOUNTER — Encounter: Payer: Self-pay | Admitting: Emergency Medicine

## 2024-02-13 DIAGNOSIS — O26893 Other specified pregnancy related conditions, third trimester: Secondary | ICD-10-CM | POA: Diagnosis present

## 2024-02-13 DIAGNOSIS — O99891 Other specified diseases and conditions complicating pregnancy: Secondary | ICD-10-CM

## 2024-02-13 DIAGNOSIS — Z3480 Encounter for supervision of other normal pregnancy, unspecified trimester: Secondary | ICD-10-CM

## 2024-02-13 DIAGNOSIS — Z3A37 37 weeks gestation of pregnancy: Secondary | ICD-10-CM | POA: Diagnosis not present

## 2024-02-13 DIAGNOSIS — M545 Low back pain, unspecified: Principal | ICD-10-CM

## 2024-02-13 DIAGNOSIS — M5459 Other low back pain: Secondary | ICD-10-CM | POA: Diagnosis not present

## 2024-02-13 LAB — STREP GP B NAA: Strep Gp B NAA: NEGATIVE

## 2024-02-13 LAB — URINALYSIS, ROUTINE W REFLEX MICROSCOPIC
Bilirubin Urine: NEGATIVE
Glucose, UA: NEGATIVE mg/dL
Hgb urine dipstick: NEGATIVE
Ketones, ur: NEGATIVE mg/dL
Nitrite: NEGATIVE
Protein, ur: NEGATIVE mg/dL
Specific Gravity, Urine: 1.019 (ref 1.005–1.030)
pH: 6 (ref 5.0–8.0)

## 2024-02-13 MED ORDER — SODIUM CHLORIDE 0.9 % IV BOLUS
500.0000 mL | Freq: Once | INTRAVENOUS | Status: AC
  Administered 2024-02-13: 500 mL via INTRAVENOUS

## 2024-02-13 MED ORDER — ACETAMINOPHEN 325 MG PO TABS
650.0000 mg | ORAL_TABLET | Freq: Once | ORAL | Status: AC
  Administered 2024-02-13: 650 mg via ORAL
  Filled 2024-02-13: qty 2

## 2024-02-13 NOTE — ED Notes (Signed)
 Pt  reports lower back pain that started today.  Pt states she has been trying to ice same with no relief. States she has had kidney stones in the past but, this does not feel like that. States she is unsure if same is coming from her pregnancy.

## 2024-02-13 NOTE — ED Triage Notes (Signed)
 Pt presents to the ED via POV with complaints of lower back pain - pt is [redacted] weeks pregnant G2P1 and was advised to come to L&D. Active baby, no bleeding, no fall. A&Ox4 at this time. Denies urinary sx, CP or SOB.

## 2024-02-13 NOTE — ED Provider Notes (Signed)
 [facility] Provider Note    Event Date/Time   First MD Initiated Contact with Patient 02/13/24 2058     (approximate)   History   Back Pain    HPI  Toni Foster is a 33 y.o. female    with a past medical history of migraine, pregnancy ADHD, who presents to the ED complaining of back pain. According to the patient, today at work she was having lower back pain, patient denies contractions associated with the lower back pain, patient denies fever, dysuria, vaginal bleeding, vaginal discharge.  Patient saw her OB/GYN in the last 2 days and everything was within normal limits.  Patient denies headache, blurry vision, abdominal pain, contractions, lower extremity edema.  Patient has history of kidney stones when she was in eighth grade.  Patient states this episode is different than when she had kidney stone.  Mother states she is feeling the baby moving.  Patient is here with her husband.     Patient Active Problem List   Diagnosis Date Noted  . Indication for care in labor and delivery, antepartum 01/01/2024  . Uterine size date discrepancy 12/27/2023  . Anxiety and depression 10/03/2023  . History of postpartum depression, currently pregnant 09/06/2023  . Supervision of other normal pregnancy, antepartum 08/26/2023  . ADD (attention deficit disorder) 05/19/2015  . Intractable chronic migraine without aura and without status migrainosus 06/21/2014  . ABNORMALITY OF GAIT 06/06/2010  . PATELLAR DISLOCATION, RIGHT 06/06/2010     ROS: Patient currently denies any vision changes, tinnitus, difficulty speaking, facial droop, sore throat, chest pain, shortness of breath, abdominal pain, nausea/vomiting/diarrhea, dysuria, or weakness/numbness/paresthesias in any extremity   Physical Exam   Triage Vital Signs: ED Triage Vitals  Encounter Vitals Group     BP 02/13/24 1946 131/82     Girls Systolic BP Percentile --      Girls Diastolic BP Percentile --       Boys Systolic BP Percentile --      Boys Diastolic BP Percentile --      Pulse Rate 02/13/24 1946 (!) 104     Resp 02/13/24 1946 18     Temp 02/13/24 1946 98 F (36.7 C)     Temp src --      SpO2 02/13/24 1946 100 %     Weight 02/13/24 1944 195 lb (88.5 kg)     Height 02/13/24 1944 5' 2 (1.575 m)     Head Circumference --      Peak Flow --      Pain Score 02/13/24 1944 8     Pain Loc --      Pain Education --      Exclude from Growth Chart --     Most recent vital signs: Vitals:   02/13/24 1946  BP: 131/82  Pulse: (!) 104  Resp: 18  Temp: 98 F (36.7 C)  SpO2: 100%     Physical Exam Vitals and nursing note reviewed.  In triage patient was tachycardic, normotensive, afebrile  Constitutional:      General: Awake and alert. No acute distress.    Appearance: Normal appearance. The patient is normal weight.      Able to speak in complete sentences without cough or dyspnea  HENT:     Head: Normocephalic and atraumatic.     Mouth: Mucous membranes are moist.  Eyes:     General: PERRL. Normal EOMs          Conjunctiva/sclera: Conjunctivae  normal.  Nose No congestion/rhinorrhea  CV:                  Good peripheral perfusion.  Regular rate and rhythm  Resp:               Normal effort.  Equal breath sounds bilaterally.  Abd:                 No distention.  Soft, nontender.  No rebound or guarding.  Abdomen: I was able to feel baby movements.  Musculoskeletal:        General: No swelling. Normal range of motion.  Lumbar spine: Skin is intact, no ecchymosis or hematomas.  Spinal process tender to palpation, no tenderness of paraspinal muscles. Skin:    General: Skin is warm and dry.     Capillary Refill: Capillary refill takes less than 2 seconds.     Findings: No rash.  Neurological:     Mental Status: The patient is awake and alert. MAE spontaneously. No gross focal neurologic deficits are appreciated.  Psychiatric Mood and affect are normal. Speech and behavior are  normal.  ED Results / Procedures / Treatments   Labs (all labs ordered are listed, but only abnormal results are displayed) Labs Reviewed  URINALYSIS, ROUTINE W REFLEX MICROSCOPIC - Abnormal; Notable for the following components:      Result Value   Color, Urine YELLOW (*)    APPearance CLOUDY (*)    Leukocytes,Ua MODERATE (*)    Bacteria, UA RARE (*)    All other components within normal limits     EKG See physician read    RADIOLOGY I independently reviewed and interpreted imaging and agree with radiologists findings.      PROCEDURES:  Critical Care performed:   Procedures   MEDICATIONS ORDERED IN ED: Medications  sodium chloride  0.9 % bolus 500 mL (500 mLs Intravenous New Bag/Given 02/13/24 2247)  acetaminophen  (TYLENOL ) tablet 650 mg (650 mg Oral Given 02/13/24 2252)   Clinical Course as of 02/13/24 2341  Thu Feb 13, 2024  2324 Urinalysis, Routine w reflex microscopic -Urine, Clean Catch(!) UA with leukocytes, squamous epithelial cells 21-50 [AE]  2337 Reassessed the patient after getting IV fluids and  acetaminophen , patient states her back pain is the same.  Updated patient about UA contaminated due to the presence of squamous cells.  Fetal tones are present.  Baby is active moving. At  this point we are going to transfer patient to L&D for contractions monitoring, possible labor.  Patient is agreeable with the plan. [AE]    Clinical Course User Index [AE] Janit Kast, PA-C    IMPRESSION / MDM / ASSESSMENT AND PLAN / ED COURSE  I reviewed the triage vital signs and the nursing notes.  Differential diagnosis includes, but is not limited to, [employer] contractions, UTI, pyelonephritis,  Patient's presentation is most consistent with acute complicated illness / injury requiring diagnostic workup.   KEIMANI LAUFER is a 33 y.o., female, [redacted] weeks pregnant who presents today with lower back pain that is started today at work.  Patient denies urinary  symptoms, symptoms of preeclampsia, or contractions.  Physical exam there is tenderness to palpation in in the lumbar spinal process, no bilateral spinal tenderness.  I ordered fetal tones, UA, acetaminophen , IV fluids.  Patient's diagnosis is consistent with lumbar back pain. Labs are  reassuring ruling out UA. I did review the patient's allergies and medications.The patient is in stable and satisfactory condition  for to go to L&D for monitoring. Discussed plan of care with patient, answered all of patient's questions, Patient agreeable to plan of care. Patient verbalized understanding.   FINAL CLINICAL IMPRESSION(S) / ED DIAGNOSES   Final diagnoses:  Acute midline low back pain without sciatica     Rx / DC Orders   ED Discharge Orders     None        Note:  This document was prepared using Dragon voice recognition software and may include unintentional dictation errors.   Janit Kast, PA-C 02/13/24 2341    Viviann Pastor, MD 02/14/24 [phone]

## 2024-02-14 DIAGNOSIS — Z3A37 37 weeks gestation of pregnancy: Secondary | ICD-10-CM | POA: Diagnosis not present

## 2024-02-14 DIAGNOSIS — M549 Dorsalgia, unspecified: Secondary | ICD-10-CM | POA: Diagnosis not present

## 2024-02-14 DIAGNOSIS — O26893 Other specified pregnancy related conditions, third trimester: Principal | ICD-10-CM

## 2024-02-14 MED ORDER — CYCLOBENZAPRINE HCL 10 MG PO TABS: 10.0000 mg | ORAL_TABLET | Freq: Once | ORAL | 0 refills | Status: DC | PRN

## 2024-02-14 MED ORDER — CYCLOBENZAPRINE HCL 5 MG PO TABS
10.0000 mg | ORAL_TABLET | Freq: Once | ORAL | Status: AC | PRN
  Administered 2024-02-14: 10 mg via ORAL
  Filled 2024-02-14: qty 2

## 2024-02-14 NOTE — Discharge Summary (Signed)
 LABOR & DELIVERY OB TRIAGE NOTE  SUBJECTIVE  HPI Toni Foster is a 33 y.o. G2P1001 at [redacted]w[redacted]d who presents to Labor & Delivery for back pain. She was evaluated in the ED and sent to L&D to r/o labor. She denies ctx, LOF, and vaginal bleeding. She has been having constant lower back pain since the AM. Tylenol  and ice have not helped.   OB History     Gravida  2   Para  1   Term  1   Preterm      AB      Living  1      SAB      IAB      Ectopic      Multiple  0   Live Births  1           Scheduled Meds: Continuous Infusions: PRN Meds:.cyclobenzaprine  OBJECTIVE  BP 131/82 (BP Location: Left Arm)   Pulse (!) 104   Temp 98 F (36.7 C)   Resp 18   Ht 5' 2 (1.575 m)   Wt 88.5 kg   LMP 05/30/2023   SpO2 100%   BMI 35.67 kg/m   General: alert, cooperative, NAD Abdomen: soft, gravid, non-tender    NST I reviewed the NST and it was reactive.  Baseline: 130 Variability: moderate Accelerations: present Decelerations:none Toco: no ctx Category  ASSESSMENT Impression  1) Pregnancy at G2P1001, [redacted]w[redacted]d, Estimated Date of Delivery: 03/05/24 2) Reassuring maternal/fetal status 3) Back pain - some relief with flexeril  PLAN 1) Discharge home with standard labor/return precautions 2) Discussed comfort measures for back pain. Encouraged chiropractor, heat/ice, massage, small rx for flexeril given  [name], CNM 02/14/24  1:17 AM

## 2024-02-18 ENCOUNTER — Telehealth: Payer: Self-pay

## 2024-02-18 NOTE — Telephone Encounter (Signed)
 Patient called. She was evaluated for back pain at ED. Prescribed flexeril  for treatment. Picked up her prescription and pharmacist recommended that she follow up to discuss risk and benefits of medication. I reached out to patient to explain that Flexeril  is prescribed to patient while pregnant. It is not going to cause any harm. If administered at a high dose for a long term it may but no studies have proven to cause harm. This is a short term medication and she should not be taking it long. She verbalized understanding.

## 2024-02-19 ENCOUNTER — Telehealth: Payer: Self-pay

## 2024-02-19 NOTE — Telephone Encounter (Signed)
 Pt left msg on triage stating she just threw up and it was black. Called her back to get more info and she said she woke up this morning feeling groggy, went to work and threw up black. It was about half a cup of black vomit. Denies abdominal, pelvic pain, fever, diarrhea. Had 2-3 blackberries last night and some contractions. Nothing this morning, baby movement is good. Per Damien CNM, monitor sx. If GI related, she will continue to vomit. Pt aware to hydrate, rest and ER if needed and/or to give her peace of mind.

## 2024-02-20 ENCOUNTER — Ambulatory Visit: Admitting: Advanced Practice Midwife

## 2024-02-20 ENCOUNTER — Encounter: Payer: Self-pay | Admitting: Advanced Practice Midwife

## 2024-02-20 VITALS — BP 108/78 | HR 108 | Wt 195.6 lb

## 2024-02-20 DIAGNOSIS — Z3483 Encounter for supervision of other normal pregnancy, third trimester: Secondary | ICD-10-CM | POA: Diagnosis not present

## 2024-02-20 DIAGNOSIS — Z3A38 38 weeks gestation of pregnancy: Secondary | ICD-10-CM | POA: Diagnosis not present

## 2024-02-20 DIAGNOSIS — O26893 Other specified pregnancy related conditions, third trimester: Secondary | ICD-10-CM

## 2024-02-20 MED ORDER — BUTALBITAL-APAP-CAFFEINE 50-325-40 MG PO CAPS: 1.0000 | ORAL_CAPSULE | Freq: Four times a day (QID) | ORAL | 3 refills | Status: DC | PRN

## 2024-02-20 NOTE — Progress Notes (Addendum)
 Routine Prenatal Care Visit  Subjective  Toni Foster is a 33 y.o. G2P1001 at [redacted]w[redacted]d being seen today for ongoing prenatal care.  She is currently monitored for the following issues for this low-risk pregnancy and has Supervision of other normal pregnancy, antepartum; Intractable chronic migraine without aura and without status migrainosus; ADD (attention deficit disorder); History of postpartum depression, currently pregnant; and Anxiety and depression on their problem list.  ----------------------------------------------------------------------------------- Patient reports headache since yesterday with accompanied nausea and vomiting. Has taken tylenol  and caffeine  with minimal relief. Reports drinking a lot of water, although urine is tea color. Having swelling in hands, legs and ankles. Having pain in her right shoulder. She also mentions itching on her legs to the point of scratching her skin raw.  Contractions: Irritability. Vag. Bleeding: None.  Movement: Present. Leaking Fluid denies.  ----------------------------------------------------------------------------------- The following portions of the patient's history were reviewed and updated as appropriate: allergies, current medications, past family history, past medical history, past social history, past surgical history and problem list. Problem list updated.  Objective  Blood pressure 108/78, pulse (!) 108, weight 195 lb 9.6 oz (88.7 kg), last menstrual period 05/30/2023 Pregravid weight 165 lb (74.8 kg) Total Weight Gain 30 lb 9.6 oz (13.9 kg) Urinalysis: Urine Protein    Urine Glucose    Fetal Status: Fetal Heart Rate (bpm): 159   Movement: Present     General:  Alert, oriented and cooperative. Patient is in no acute distress.  Skin: Skin is warm and dry. No rash noted.   Cardiovascular: Normal heart rate noted  Respiratory: Normal respiratory effort, no problems with respiration noted  Abdomen: Soft, gravid, appropriate for  gestational age. Pain/Pressure: Absent     Pelvic:  Cervical exam deferred        Extremities: Normal range of motion.  Edema: None  Mental Status: Normal mood and affect. Normal behavior. Normal judgment and thought content.   Assessment   32 y.o. G2P1001 at [redacted]w[redacted]d by  03/05/2024, by Last Menstrual Period presenting for routine prenatal visit  Plan   Second Pregnancy Problems (from 08/26/23 to present)     Problem Noted Diagnosed Resolved   History of postpartum depression, currently pregnant 09/06/2023 by Jayne Harlene CROME, CNM  No   Supervision of other normal pregnancy, antepartum 08/26/2023 by Delana Rollo PARAS, CMA  No   Overview Addendum 01/13/2024  8:50 AM by Magdalena Saddler, Student-MidWife   Clinical Staff Provider  Office Location  [facility] Dating  Not found.  Language  English Anatomy US     Flu Vaccine  Will get vaccine at initial prenatal visit Genetic Screen  NIPS:   TDaP vaccine    Hgb A1C or  GTT Early : Third trimester :   Covid Declined   LAB RESULTS   Rhogam  A/Positive/-- (02/06 1505)  Blood Type A/Positive/-- (02/06 1505)   RSV  Antibody Negative (02/06 1505)  Feeding Plan breast Rubella 2.65 (02/06 1505)  Contraception BTL? RPR Non Reactive (05/02 1543)   Circumcision yes HBsAg Negative (02/06 1505)   Pediatrician  [facility] HIV Non Reactive (05/02 1543)  Support Person Spouse and Sister Varicella Reactive (02/06 1505)  Prenatal Classes declined GBS  (For PCN allergy, check sensitivities)     Hep C Non Reactive (02/06 1505)   BTL Consent  Pap Diagnosis  Date Value Ref Range Status  09/05/2023   Final   - Negative for intraepithelial lesion or malignancy (NILM)    VBAC Consent  Hgb Electro  CF      SMA                    Term labor symptoms and general obstetric precautions including but not limited to vaginal bleeding, contractions, leaking of fluid and fetal movement were reviewed in detail with the patient. Please  refer to After Visit Summary for other counseling recommendations.   Headache: Rx Fioricet, alternatively can try benadryl , increase hydration Itching: Aveeno bath, moisturizer Swelling: increase hydration, elevate legs   Return in about 1 week (around 02/27/2024) for rob.  Slater Rains, CNM 02/20/2024 11:21 AM

## 2024-02-21 ENCOUNTER — Encounter: Admitting: Advanced Practice Midwife

## 2024-02-27 NOTE — Progress Notes (Unsigned)
    Return Prenatal Note   Subjective   33 y.o. G2P1001 at [redacted]w[redacted]d presents for this follow-up prenatal visit.  Patient has been having some painful contractions. Has swelling in her hands, ankles and feet on and off.  Patient reports: Movement: Present Contractions: Regular  Objective   Flow sheet Vitals: Pulse Rate: 81 BP: 122/81 Fundal Height: 39 cm Fetal Heart Rate (bpm): 120 Presentation: Vertex ([place]) Dilation: Closed Effacement (%): 0 Station: -3 Total weight gain: 31 lb 11.2 oz (14.4 kg)  General Appearance  No acute distress, well appearing, and well nourished Pulmonary   Normal work of breathing Neurologic   Alert and oriented to person, place, and time Psychiatric   Mood and affect within normal limits  Assessment/Plan   Plan  33 y.o. G2P1001 at [redacted]w[redacted]d presents for follow-up OB visit. Reviewed prenatal record including previous visit note.  Supervision of other normal pregnancy, antepartum Reviewed ways to encourage labor. Will desire 41 week IOL. Needs to be scheduled at [facility] cl/thick/high, posterior. Unable to sweep today.  Reviewed labor warning signs and expectations for birth. Instructed to call office or come to hospital with persistent headache, vision changes, regular contractions, leaking of fluid, decreased fetal movement or vaginal bleeding.       No orders of the defined types were placed in this encounter.  No follow-ups on file.   No future appointments.   For next visit:  continue with routine prenatal care     Damien Parsley, CNM [facility] 08/01/253:05 PM

## 2024-02-28 ENCOUNTER — Ambulatory Visit (INDEPENDENT_AMBULATORY_CARE_PROVIDER_SITE_OTHER): Admitting: Certified Nurse Midwife

## 2024-02-28 ENCOUNTER — Encounter: Payer: Self-pay | Admitting: Certified Nurse Midwife

## 2024-02-28 VITALS — BP 122/81 | HR 81 | Wt 196.7 lb

## 2024-02-28 DIAGNOSIS — Z3480 Encounter for supervision of other normal pregnancy, unspecified trimester: Secondary | ICD-10-CM | POA: Diagnosis not present

## 2024-02-28 NOTE — Assessment & Plan Note (Signed)
 Reviewed ways to encourage labor. Will desire 41 week IOL. Needs to be scheduled at [facility] cl/thick/high, posterior. Unable to sweep today.  Reviewed labor warning signs and expectations for birth. Instructed to call office or come to hospital with persistent headache, vision changes, regular contractions, leaking of fluid, decreased fetal movement or vaginal bleeding.

## 2024-03-05 ENCOUNTER — Encounter: Admitting: Advanced Practice Midwife

## 2024-03-05 NOTE — Patient Instructions (Incomplete)
 Signs and Symptoms of Labor Labor is the body's natural process of moving the baby and the placenta out of the uterus. The process of labor usually starts when the baby is full-term, between 18 and 41 weeks of pregnancy. Signs and symptoms that you are close to going into labor As your body prepares for labor and the birth of your baby, you may notice the following symptoms in the weeks and days before true labor starts: Passing a small amount of thick, bloody mucus from your vagina. This is called normal bloody show or losing your mucus plug. This may happen more than a week before labor begins, or right before labor begins, as the opening of the cervix starts to widen (dilate). For some women, the entire mucus plug passes at once. For others, pieces of the mucus plug may gradually pass over several days. Your baby moving (dropping) lower in your pelvis to get into position for birth (lightening). When this happens, you may feel more pressure on your bladder and pelvic bone and less pressure on your ribs. This may make it easier to breathe. It may also cause you to need to urinate more often and have problems with bowel movements. Having practice contractions, also called Braxton Hicks contractions or false labor. These occur at irregular (unevenly spaced) intervals that are more than 10 minutes apart. False labor contractions are common after exercise or sexual activity. They will stop if you change position, rest, or drink fluids. These contractions are usually mild and do not get stronger over time. They may feel like: A backache or back pain. Mild cramps, similar to menstrual cramps. Tightening or pressure in your abdomen. Other early symptoms include: Nausea or loss of appetite. Diarrhea. Having a sudden burst of energy, or feeling very tired. Mood changes. Having trouble sleeping. Signs and symptoms that labor has begun Signs that you are in labor may include: Having contractions that come  at regular (evenly spaced) intervals and increase in intensity. This may feel like more intense tightening or pressure in your abdomen that moves to your back. Contractions may also feel like rhythmic pain in your upper thighs or back that comes and goes at regular intervals. If you are delivering for the first time, this change in intensity of contractions often occurs at a more gradual pace. If you have given birth before, you may notice a more rapid progression of contraction changes. Feeling pressure in the vaginal area. Your water breaking (rupture of membranes). This is when the sac of fluid that surrounds your baby breaks. Fluid leaking from your vagina may be clear or blood-tinged. Labor usually starts within 24 hours of your water breaking, but it may take longer to begin. Some people may feel a sudden gush of fluid; others may notice repeatedly damp underwear. Follow these instructions at home:  When labor starts, or if your water breaks, call your health care provider or nurse care line. Based on your situation, they will determine when you should go in for an exam. During early labor, you may be able to rest and manage symptoms at home. Some strategies to try at home include: Breathing and relaxation techniques. Taking a warm bath or shower. Listening to music. Using a heating pad on the lower back for pain. If directed, apply heat to the area as often as told by your health care provider. Use the heat source that your health care provider recommends, such as a moist heat pack or a heating pad. Place a  towel between your skin and the heat source. Leave the heat on for 20-30 minutes. Remove the heat if your skin turns bright red. This is especially important if you are unable to feel pain, heat, or cold. You have a greater risk of getting burned. Contact a health care provider if: Your labor has started. Your water breaks. You have nausea, vomiting, or diarrhea. Get help right away  if: You have painful, regular contractions that are 5 minutes apart or less. Labor starts before you are [redacted] weeks along in your pregnancy. You have a fever. You have bright red blood coming from your vagina. You do not feel your baby moving. You have a severe headache with or without vision problems. You have chest pain or shortness of breath. These symptoms may represent a serious problem that is an emergency. Do not wait to see if the symptoms will go away. Get medical help right away. Call your local emergency services (911 in the [place] Do not drive yourself to the hospital. Summary Labor is your body's natural process of moving your baby and the placenta out of your uterus. The process of labor usually starts when your baby is full-term, between 15 and 40 weeks of pregnancy. When labor starts, or if your water breaks, call your health care provider or nurse care line. Based on your situation, they will determine when you should go in for an exam. This information is not intended to replace advice given to you by your health care provider. Make sure you discuss any questions you have with your health care provider. Document Revised: 11/29/2020 Document Reviewed: 11/29/2020 Elsevier Patient Education  2024 [employer].

## 2024-03-05 NOTE — Progress Notes (Unsigned)
 Routine Prenatal Care Visit  Subjective  Toni Foster is a 33 y.o. G2P1001 at [redacted]w[redacted]d being seen today for ongoing prenatal care.  She is currently monitored for the following issues for this {Blank single:19197::high-risk,low-risk} pregnancy and has Supervision of other normal pregnancy, antepartum; Intractable chronic migraine without aura and without status migrainosus; ADD (attention deficit disorder); History of postpartum depression, currently pregnant; and Anxiety and depression on their problem list.  ----------------------------------------------------------------------------------- Patient reports {sx:14538}.    .  .   [name] Fluid {Actions; denies/reports/admits to:19208}.  ----------------------------------------------------------------------------------- The following portions of the patient's history were reviewed and updated as appropriate: allergies, current medications, past family history, past medical history, past social history, past surgical history and problem list. Problem list updated.  Objective  Last menstrual period 05/30/2023, unknown if currently breastfeeding. Pregravid weight 165 lb (74.8 kg) Total Weight Gain 31 lb 11.2 oz (14.4 kg) Urinalysis: Urine Protein    Urine Glucose    Fetal Status:           Fetus A Non-Stress Test Interpretation for 03/05/24  Indication: {AOB NST Indications:29190}            General:  Alert, oriented and cooperative. Patient is in no acute distress.  Skin: Skin is warm and dry. No rash noted.   Cardiovascular: Normal heart rate noted  Respiratory: Normal respiratory effort, no problems with respiration noted  Abdomen: Soft, gravid, appropriate for gestational age.       Pelvic:  {Blank single:19197::Cervical exam performed,Cervical exam deferred}        Extremities: Normal range of motion.     Mental Status: Normal mood and affect. Normal behavior. Normal judgment and thought content.   Assessment    32 y.o. G2P1001 at [redacted]w[redacted]d by  03/05/2024, by Last Menstrual Period presenting for {Blank single:19197::routine,work-in} prenatal visit  Plan   Second Pregnancy Problems (from 08/26/23 to present)     Problem Noted Diagnosed Resolved   History of postpartum depression, currently pregnant 09/06/2023 by Jayne Harlene CROME, CNM  No   Supervision of other normal pregnancy, antepartum 08/26/2023 by Delana Rollo PARAS, CMA  No   Overview Addendum 01/13/2024  8:50 AM by Magdalena Saddler, Student-MidWife   Clinical Staff Provider  Office Location  [facility] Dating  Not found.  Language  English Anatomy US     Flu Vaccine  Will get vaccine at initial prenatal visit Genetic Screen  NIPS:   TDaP vaccine    Hgb A1C or  GTT Early : Third trimester :   Covid Declined   LAB RESULTS   Rhogam  A/Positive/-- (02/06 1505)  Blood Type A/Positive/-- (02/06 1505)   RSV  Antibody Negative (02/06 1505)  Feeding Plan breast Rubella 2.65 (02/06 1505)  Contraception BTL? RPR Non Reactive (05/02 1543)   Circumcision yes HBsAg Negative (02/06 1505)   Pediatrician  [facility] HIV Non Reactive (05/02 1543)  Support Person Spouse and Sister Varicella Reactive (02/06 1505)  Prenatal Classes declined GBS  (For PCN allergy, check sensitivities)     Hep C Non Reactive (02/06 1505)   BTL Consent  Pap Diagnosis  Date Value Ref Range Status  09/05/2023   Final   - Negative for intraepithelial lesion or malignancy (NILM)    VBAC Consent  Hgb Electro      CF      SMA                    {Blank single:19197::Term,Preterm} labor symptoms  and general obstetric precautions including but not limited to vaginal bleeding, contractions, leaking of fluid and fetal movement were reviewed in detail with the patient. Please refer to After Visit Summary for other counseling recommendations.   No follow-ups on file.  Mit[place], [place] 07/31/7972 6:55 PM

## 2024-03-06 ENCOUNTER — Encounter: Payer: Self-pay | Admitting: Advanced Practice Midwife

## 2024-03-06 ENCOUNTER — Ambulatory Visit: Admitting: Advanced Practice Midwife

## 2024-03-06 VITALS — BP 121/85 | HR 107 | Wt 192.6 lb

## 2024-03-06 DIAGNOSIS — Z3A40 40 weeks gestation of pregnancy: Secondary | ICD-10-CM

## 2024-03-06 DIAGNOSIS — O480 Post-term pregnancy: Secondary | ICD-10-CM

## 2024-03-06 DIAGNOSIS — Z3480 Encounter for supervision of other normal pregnancy, unspecified trimester: Secondary | ICD-10-CM

## 2024-03-06 NOTE — Progress Notes (Signed)
 Routine Prenatal Care Visit  Subjective  Toni Foster is a 33 y.o. G2P1001 at [redacted]w[redacted]d being seen today for ongoing prenatal care.  She is currently monitored for the following issues for this high-risk pregnancy and has Supervision of other normal pregnancy, antepartum; Intractable chronic migraine without aura and without status migrainosus; ADD (attention deficit disorder); History of postpartum depression, currently pregnant; and Anxiety and depression on their problem list.  ----------------------------------------------------------------------------------- Patient reports she is ready to have baby. IOL scheduled. Questions answered. Accompanied by her husband today.   Contractions: Irregular. Vag. Bleeding: None.  Movement: Present. Leaking Fluid denies.  ----------------------------------------------------------------------------------- The following portions of the patient's history were reviewed and updated as appropriate: allergies, current medications, past family history, past medical history, past social history, past surgical history and problem list. Problem list updated.  Objective  Blood pressure 121/85, pulse (!) 107, weight 192 lb 9.6 oz (87.4 kg), last menstrual period 05/30/2023 Pregravid weight 165 lb (74.8 kg) Total Weight Gain 27 lb 9.6 oz (12.5 kg) Urinalysis: Urine Protein    Urine Glucose    Fetal Status: Fetal Heart Rate (bpm): 125   Movement: Present  Presentation: Vertex  General:  Alert, oriented and cooperative. Patient is in no acute distress.  Skin: Skin is warm and dry. No rash noted.   Cardiovascular: Normal heart rate noted  Respiratory: Normal respiratory effort, no problems with respiration noted  Abdomen: Soft, gravid, appropriate for gestational age. Pain/Pressure: Absent     Pelvic:  Cervical sweep Dilation: 3 Effacement (%): 60 Station: -2  Extremities: Normal range of motion.  Edema: None  Mental Status: Normal mood and affect. Normal behavior.  Normal judgment and thought content.   Assessment   32 y.o. G2P1001 at [redacted]w[redacted]d by  03/05/2024, by Last Menstrual Period presenting for routine prenatal visit  Plan   Second Pregnancy Problems (from 08/26/23 to present)     Problem Noted Diagnosed Resolved   History of postpartum depression, currently pregnant 09/06/2023 by Jayne Harlene CROME, CNM  No   Supervision of other normal pregnancy, antepartum 08/26/2023 by Delana Rollo PARAS, CMA  No   Overview Addendum 01/13/2024  8:50 AM by Magdalena Saddler, Student-MidWife   Clinical Staff Provider  Office Location  [facility] Dating  Not found.  Language  English Anatomy US     Flu Vaccine  Will get vaccine at initial prenatal visit Genetic Screen  NIPS:   TDaP vaccine    Hgb A1C or  GTT Early : Third trimester :   Covid Declined   LAB RESULTS   Rhogam  A/Positive/-- (02/06 1505)  Blood Type A/Positive/-- (02/06 1505)   RSV  Antibody Negative (02/06 1505)  Feeding Plan breast Rubella 2.65 (02/06 1505)  Contraception BTL? RPR Non Reactive (05/02 1543)   Circumcision yes HBsAg Negative (02/06 1505)   Pediatrician  [facility] HIV Non Reactive (05/02 1543)  Support Person Spouse and Sister Varicella Reactive (02/06 1505)  Prenatal Classes declined GBS  (For PCN allergy, check sensitivities)     Hep C Non Reactive (02/06 1505)   BTL Consent  Pap Diagnosis  Date Value Ref Range Status  09/05/2023   Final   - Negative for intraepithelial lesion or malignancy (NILM)    VBAC Consent  Hgb Electro      CF      SMA                    Term labor symptoms and general obstetric precautions including but  not limited to vaginal bleeding, contractions, leaking of fluid and fetal movement were reviewed in detail with the patient. Please refer to After Visit Summary for other counseling recommendations.   IOL tomorrow at 8 AM Induction orders placed   Slater Rains, [facility] 03/06/2024 11:49 AM

## 2024-03-07 ENCOUNTER — Inpatient Hospital Stay
Admission: EM | Admit: 2024-03-07 | Discharge: 2024-03-10 | DRG: 785 | Disposition: A | Discharge status: Home / Self Care | Source: Ambulatory Visit | Attending: Certified Nurse Midwife | Admitting: Certified Nurse Midwife

## 2024-03-07 ENCOUNTER — Inpatient Hospital Stay: Admitting: Anesthesiology

## 2024-03-07 ENCOUNTER — Other Ambulatory Visit: Payer: Self-pay

## 2024-03-07 ENCOUNTER — Encounter: Payer: Self-pay | Admitting: Advanced Practice Midwife

## 2024-03-07 DIAGNOSIS — O480 Post-term pregnancy: Principal | ICD-10-CM | POA: Diagnosis present

## 2024-03-07 DIAGNOSIS — Z3490 Encounter for supervision of normal pregnancy, unspecified, unspecified trimester: Secondary | ICD-10-CM | POA: Diagnosis present

## 2024-03-07 DIAGNOSIS — Z3480 Encounter for supervision of other normal pregnancy, unspecified trimester: Secondary | ICD-10-CM

## 2024-03-07 DIAGNOSIS — Z8659 Personal history of other mental and behavioral disorders: Secondary | ICD-10-CM

## 2024-03-07 DIAGNOSIS — Z79899 Other long term (current) drug therapy: Secondary | ICD-10-CM | POA: Diagnosis not present

## 2024-03-07 DIAGNOSIS — Z302 Encounter for sterilization: Secondary | ICD-10-CM

## 2024-03-07 DIAGNOSIS — Z3A40 40 weeks gestation of pregnancy: Secondary | ICD-10-CM | POA: Diagnosis not present

## 2024-03-07 DIAGNOSIS — O99891 Other specified diseases and conditions complicating pregnancy: Principal | ICD-10-CM

## 2024-03-07 LAB — CBC
HCT: 35.6 % — ABNORMAL LOW (ref 36.0–46.0)
Hemoglobin: 11.6 g/dL — ABNORMAL LOW (ref 12.0–15.0)
MCH: 30.9 pg (ref 26.0–34.0)
MCHC: 32.6 g/dL (ref 30.0–36.0)
MCV: 94.7 fL (ref 80.0–100.0)
Platelets: 267 K/uL (ref 150–400)
RBC: 3.76 MIL/uL — ABNORMAL LOW (ref 3.87–5.11)
RDW: 14.6 % (ref 11.5–15.5)
WBC: 9.3 K/uL (ref 4.0–10.5)
nRBC: 0 % (ref 0.0–0.2)

## 2024-03-07 LAB — TYPE AND SCREEN
ABO/RH(D): A POS
Antibody Screen: NEGATIVE

## 2024-03-07 MED ORDER — FENTANYL-BUPIVACAINE-NACL 0.5-0.125-0.9 MG/250ML-% EP SOLN: 12.0000 mL/h | EPIDURAL | Status: DC | PRN

## 2024-03-07 MED ORDER — PHENYLEPHRINE 80 MCG/ML (10ML) SYRINGE FOR IV PUSH (FOR BLOOD PRESSURE SUPPORT): 80.0000 ug | PREFILLED_SYRINGE | INTRAVENOUS | Status: DC | PRN

## 2024-03-07 MED ORDER — CALCIUM CARBONATE ANTACID 500 MG PO CHEW
2.0000 | CHEWABLE_TABLET | ORAL | Status: DC | PRN
  Administered 2024-03-07 – 2024-03-08 (×3): 400 mg via ORAL
  Filled 2024-03-07 (×3): qty 2

## 2024-03-07 MED ORDER — AMMONIA AROMATIC IN INHA
RESPIRATORY_TRACT | Status: AC
  Filled 2024-03-07: qty 10

## 2024-03-07 MED ORDER — OXYTOCIN-SODIUM CHLORIDE 30-0.9 UT/500ML-% IV SOLN: 2.5000 [IU]/h | INTRAVENOUS | Status: DC

## 2024-03-07 MED ORDER — FENTANYL-BUPIVACAINE-NACL 0.5-0.125-0.9 MG/250ML-% EP SOLN
EPIDURAL | Status: AC
Start: 2024-03-07 — End: 2024-03-07
  Filled 2024-03-07: qty 250

## 2024-03-07 MED ORDER — LIDOCAINE-EPINEPHRINE (PF) 1.5 %-1:200000 IJ SOLN
INTRAMUSCULAR | Status: DC | PRN
  Administered 2024-03-07: 3 mL via PERINEURAL

## 2024-03-07 MED ORDER — MISOPROSTOL 200 MCG PO TABS
ORAL_TABLET | ORAL | Status: AC
  Filled 2024-03-07: qty 4

## 2024-03-07 MED ORDER — LIDOCAINE HCL (PF) 1 % IJ SOLN
INTRAMUSCULAR | Status: DC | PRN
  Administered 2024-03-07: 3 mL

## 2024-03-07 MED ORDER — PROPRANOLOL HCL 1 MG/ML IV SOLN
2.0000 mg | Freq: Once | INTRAVENOUS | Status: AC
  Administered 2024-03-07: 2 mg via INTRAVENOUS
  Filled 2024-03-07: qty 2

## 2024-03-07 MED ORDER — EPHEDRINE 5 MG/ML INJ: 10.0000 mg | INTRAVENOUS | Status: DC | PRN

## 2024-03-07 MED ORDER — MISOPROSTOL 50MCG HALF TABLET
50.0000 ug | ORAL_TABLET | ORAL | Status: DC
  Filled 2024-03-07: qty 1

## 2024-03-07 MED ORDER — OXYTOCIN-SODIUM CHLORIDE 30-0.9 UT/500ML-% IV SOLN
1.0000 m[IU]/min | INTRAVENOUS | Status: DC
  Administered 2024-03-07: 2 m[IU]/min via INTRAVENOUS
  Filled 2024-03-07 (×2): qty 500

## 2024-03-07 MED ORDER — OXYTOCIN BOLUS FROM INFUSION: 333.0000 mL | Freq: Once | INTRAVENOUS | Status: DC

## 2024-03-07 MED ORDER — LIDOCAINE HCL (PF) 1 % IJ SOLN
30.0000 mL | INTRAMUSCULAR | Status: DC | PRN
  Filled 2024-03-07: qty 30

## 2024-03-07 MED ORDER — LACTATED RINGERS IV SOLN: 500.0000 mL | Freq: Once | INTRAVENOUS | Status: DC

## 2024-03-07 MED ORDER — TERBUTALINE SULFATE 1 MG/ML IJ SOLN: 0.2500 mg | Freq: Once | INTRAMUSCULAR | Status: DC | PRN

## 2024-03-07 MED ORDER — LACTATED RINGERS IV SOLN
500.0000 mL | INTRAVENOUS | Status: AC | PRN
  Administered 2024-03-07: 1000 mL via INTRAVENOUS
  Administered 2024-03-07: 500 mL via INTRAVENOUS

## 2024-03-07 MED ORDER — MISOPROSTOL 25 MCG QUARTER TABLET
25.0000 ug | ORAL_TABLET | ORAL | Status: DC
  Filled 2024-03-07: qty 1

## 2024-03-07 MED ORDER — FENTANYL-BUPIVACAINE-NACL 0.5-0.125-0.9 MG/250ML-% EP SOLN
EPIDURAL | Status: DC | PRN
  Administered 2024-03-07: 12 mL/h via EPIDURAL

## 2024-03-07 MED ORDER — ONDANSETRON HCL 4 MG/2ML IJ SOLN
4.0000 mg | Freq: Four times a day (QID) | INTRAMUSCULAR | Status: DC | PRN
  Administered 2024-03-07: 4 mg via INTRAVENOUS
  Filled 2024-03-07: qty 2

## 2024-03-07 MED ORDER — OXYTOCIN 10 UNIT/ML IJ SOLN
INTRAMUSCULAR | Status: AC
  Filled 2024-03-07: qty 2

## 2024-03-07 MED ORDER — BUPIVACAINE HCL (PF) 0.25 % IJ SOLN
INTRAMUSCULAR | Status: DC | PRN
  Administered 2024-03-07 (×2): 5 mL via EPIDURAL

## 2024-03-07 MED ORDER — ACETAMINOPHEN 325 MG PO TABS
650.0000 mg | ORAL_TABLET | ORAL | Status: DC | PRN
  Administered 2024-03-07: 650 mg via ORAL
  Filled 2024-03-07: qty 2

## 2024-03-07 MED ORDER — LACTATED RINGERS IV SOLN: INTRAVENOUS | Status: DC

## 2024-03-07 MED ORDER — DIPHENHYDRAMINE HCL 50 MG/ML IJ SOLN
12.5000 mg | INTRAMUSCULAR | Status: DC | PRN
  Administered 2024-03-08: 12.5 mg via INTRAVENOUS

## 2024-03-07 NOTE — Progress Notes (Signed)
 Labor Progress Note   ASSESSMENT/PLAN   Toni Foster 32 y.o.   G2P1001  at [redacted]w[redacted]d here for IOL.  FWB:  - Fetal well being assessed: Category 1        GBS: - GBS negative  LABOR: - Not yet in labor - Pitocin  infusing and being titrated per protocol - Pain Management: plans epidural - Anticipate SVD   Principal Problem:   Encounter for induction of labor   SUBJECTIVE/OBJECTIVE   SUBJECTIVE:  Toni Foster is not yet feeling contractions. She is up on the birth ball and eating lunch.   OBJECTIVE: Vital Signs: Patient Vitals for the past 12 hrs:  BP Temp Temp src Pulse Resp Height Weight  03/07/24 1103 123/70 -- -- 78 16 -- --  03/07/24 1102 -- 98.5 F (36.9 C) Oral -- -- -- --  03/07/24 0815 120/78 97.9 F (36.6 C) Oral 96 18 5' 2 (1.575 m) 87.4 kg    Last SVE:  Dilation: 3 Effacement (%): 70 Station: -2 Presentation: Vertex Exam by:: EMERSON Canny CNM   FHR:   - Baseline: 130 - Variability: moderate - Accels: present - Decels: none Category 1  UTERINE ACTIVITY:  Contractions: q 2-5 min  [name] CHRISTELLA Canny, CNM

## 2024-03-07 NOTE — Anesthesia Procedure Notes (Signed)
 Epidural Patient location during procedure: OB Start time: 03/07/2024 6:45 PM End time: 03/07/2024 6:50 PM  Staffing Anesthesiologist: Chesley Lendia CROME, MD Performed: anesthesiologist   Preanesthetic Checklist Completed: patient identified, IV checked, site marked, risks and benefits discussed, surgical consent, monitors and equipment checked, pre-op evaluation and timeout performed  Epidural Patient position: sitting Prep: ChloraPrep Patient monitoring: heart rate, continuous pulse ox and blood pressure Approach: midline Location: L3-L4 Injection technique: LOR saline  Needle:  Needle type: Tuohy  Needle gauge: 17 G Needle length: 9 cm and 9 Needle insertion depth: 8 cm Catheter type: closed end flexible Catheter size: 19 Gauge Catheter at skin depth: 13 cm Test dose: negative and 1.5% lidocaine  with Epi 1:200 K  Assessment Sensory level: T10 Events: blood not aspirated, injection not painful, no injection resistance, no paresthesia and negative IV test  Additional Notes 1 attempt Pt. Evaluated and documentation done after procedure finished. Patient identified. Risks/Benefits/Options discussed with patient including but not limited to bleeding, infection, nerve damage, paralysis, failed block, incomplete pain control, headache, blood pressure changes, nausea, vomiting, reactions to medication both or allergic, itching and postpartum back pain. Confirmed with bedside nurse the patient's most recent platelet count. Confirmed with patient that they are not currently taking any anticoagulation, have any bleeding history or any family history of bleeding disorders. Patient expressed understanding and wished to proceed. All questions were answered. Sterile technique was used throughout the entire procedure. Please see nursing notes for vital signs. Test dose was given through epidural catheter and negative prior to continuing to dose epidural or start infusion. Warning signs of high block  given to the patient including shortness of breath, tingling/numbness in hands, complete motor block, or any concerning symptoms with instructions to call for help. Patient was given instructions on fall risk and not to get out of bed. All questions and concerns addressed with instructions to call with any issues or inadequate analgesia.    Patient tolerated the insertion well without immediate complications. Reason for block:procedure for pain

## 2024-03-07 NOTE — OB Triage Note (Signed)
 Patient arrived to LDR 4 for scheduled induction of labor. Reports good fetal movement. Denies leaking of fluid, contractions, or vaginal bleeding. EFM applied and assessing. Initial VS WNL.

## 2024-03-07 NOTE — Progress Notes (Signed)
 Labor Progress Note   ASSESSMENT/PLAN   Toni Foster 32 y.o.   G2P1001  at [redacted]w[redacted]d here for IOL.  FWB:  - Fetal well being assessed: Category I/II        GBS: - GBS negative  LABOR: -  Latent labor, doing well. - 2-minute decel d/t hypotension after epidural was placed. FHR recovered with position change, IVF bolus, and decrease in Pitocin  to 10 mU/min. - Will continue to titrate Pitocin  as needed for adequate contractions. - Discussed options with Toni Foster. AROM performed for light MSF. - Pain Management: Epidural - Anticipate SVD   Labor Progress Cervical Exam  Last 3 exams Dil Eff Sta Exam Time  5 70 -2 2017  4 70 -2 1821  4 70 -2 1703    Principal Problem:   Encounter for induction of labor   SUBJECTIVE/OBJECTIVE   SUBJECTIVE:  Toni Foster is comfortable with her epidural and would like to get into active labor.   OBJECTIVE: Vital Signs: Patient Vitals for the past 12 hrs:  BP Temp Temp src Pulse Resp SpO2  03/07/24 2017 (!) 104/54 -- -- 90 -- 100 %  03/07/24 2000 (!) 109/56 -- -- 87 -- 100 %  03/07/24 1944 (!) 102/53 -- -- 85 -- --  03/07/24 1941 (!) 100/52 -- -- 87 -- --  03/07/24 1935 (!) 91/47 -- -- 89 -- 99 %  03/07/24 1931 (!) 86/70 -- -- 68 -- --  03/07/24 1930 (!) 79/46 -- -- 70 -- 98 %  03/07/24 1915 108/67 -- -- 97 -- 99 %  03/07/24 1910 109/66 -- -- 95 -- 98 %  03/07/24 1905 110/73 -- -- 94 -- 99 %  03/07/24 1859 123/72 -- -- 84 -- --  03/07/24 1857 136/86 -- -- 91 -- 99 %  03/07/24 1852 134/78 -- -- 96 -- --  03/07/24 1502 105/64 -- -- 85 -- --  03/07/24 1501 -- 98.1 F (36.7 C) Oral -- -- --  03/07/24 1103 123/70 -- -- 78 16 --  03/07/24 1102 -- 98.5 F (36.9 C) Oral -- -- --    Last SVE:  Dilation: 5 Effacement (%): 70 Cervical Position: Middle Station: -2 Presentation: Vertex Exam by:: Birdella Sippel, CNM  FHR:   - Baseline: 130 - Variability: mdoerate - Accels: present - Decels: 1 prolonged decel, no decels in past hour Category  I/II  UTERINE ACTIVITY:  Contractions: q 2-3 min  Toni Foster Toni Foster, CNM

## 2024-03-07 NOTE — Progress Notes (Signed)
 Labor Progress Note   ASSESSMENT/PLAN   Toni Foster 32 y.o.   G2P1001  at [redacted]w[redacted]d here for IOL.  FWB:  - Fetal well being assessed: Category 1        GBS: - GBS negative  LABOR: -  Latent labor, doing well. - Discussed options with Toni Foster. Will continue with Pitocin  at 30 mU/min for an hour and reassess. We discussed AROM. She prefers to have an epidural prior to AROM. - Pain Management: plans epidural - Anticipate SVD   Labor Progress  Cervical Exam  Last 3 exams Dil Eff Sta Exam Time  4 70 -2 1703  3 70 -2 1309  3 70 -2 0914    Principal Problem:   Encounter for induction of labor   SUBJECTIVE/OBJECTIVE   SUBJECTIVE:  Toni Foster is starting to feel cramping but is not having painful contractions yet.    OBJECTIVE: Vital Signs: Patient Vitals for the past 12 hrs:  BP Temp Temp src Pulse Resp Height Weight  03/07/24 1502 105/64 -- -- 85 -- -- --  03/07/24 1501 -- 98.1 F (36.7 C) Oral -- -- -- --  03/07/24 1103 123/70 -- -- 78 16 -- --  03/07/24 1102 -- 98.5 F (36.9 C) Oral -- -- -- --  03/07/24 0815 120/78 97.9 F (36.6 C) Oral 96 18 5' 2 (1.575 m) 87.4 kg    Last SVE:  Dilation: 4 Effacement (%): 70 Station: -2 Presentation: Vertex Exam by:: EMERSON Canny CNM   FHR:   - Baseline: 115 - Variability: moderate - Accels: present - Decels: none Category 1  UTERINE ACTIVITY:  Contractions: q 1.5-3 minutes  [name] CHRISTELLA Canny, CNM

## 2024-03-07 NOTE — H&P (Signed)
 [facility] Labor & Delivery  History and Physical  ASSESSMENT AND PLAN   Toni Foster is a 33 y.o. G2P1001 at [redacted]w[redacted]d with EDD: 03/05/2024, by Last Menstrual Period admitted for PDIOL.  1. Admit to L&D :   2. EFM: Continuous -- Category 1 3. Admission labs  4. Induction of labor with Pitocin  5. Plans epidural 6. Anticipate NSVD 7. MD notified of admission    Fetal Status: - cephalic presentation by BSUS 2/84/74, Leopold's, and SVE - EFW: 8.5 lbs by Leopold's - FHT currently category 1  Labs/Immunizations: Prenatal labs and studies: ABO, Rh: --/--/PENDING (08/09 0830) Antibody: PENDING (08/09 0830) Rubella: 2.65 (02/06 1505) Varicella: Reactive (02/06 1505)  RPR: Non Reactive (05/02 1543)  HBsAg: Negative (02/06 1505)  HepC: Non Reactive (02/06 1505) HIV: Non Reactive (05/02 1543)  GBS: Negative/-- (07/15 1604)   TDAP: received prenatally Flu: no RSV: no  Postpartum Plan: - Feeding: Breast Milk - Contraception: considering interval BTL - Prenatal Care Provider: AOB   HPI   Chief Complaint: IOL  Toni Foster is a 33 y.o. G2P1001 at [redacted]w[redacted]d who presents for induction of labor. She is having some occasional, mild contractions. She denies LOF and vaginal bleeding. She endorses good fetal movement.   Pregnancy Complications Patient Active Problem List   Diagnosis Date Noted   Encounter for induction of labor 03/07/2024   Anxiety and depression 10/03/2023   History of postpartum depression, currently pregnant 09/06/2023   Supervision of other normal pregnancy, antepartum 08/26/2023   ADD (attention deficit disorder) 05/19/2015   Intractable chronic migraine without aura and without status migrainosus 06/21/2014    Review of Systems A twelve point review of systems was negative except as stated in HPI.   HISTORY   Medications Medications Prior to Admission  Medication Sig Dispense Refill Last Dose/Taking   cetirizine (ZYRTEC) 10 MG  chewable tablet Chew 10 mg by mouth daily.   03/07/2024 Morning   escitalopram  (LEXAPRO ) 10 MG tablet Take 1 tablet (10 mg total) by mouth daily. 90 tablet 1 03/07/2024 Morning   ferrous sulfate  324 MG TBEC Take 27 mg by mouth daily.   Taking   ondansetron  (ZOFRAN -ODT) 4 MG disintegrating tablet Take 1 tablet (4 mg total) by mouth every 8 (eight) hours as needed for nausea or vomiting. 30 tablet 1 Past Month   Prenatal Vit-Fe Fumarate-FA (MULTIVITAMIN-PRENATAL) 27-0.8 MG TABS tablet Take 1 tablet by mouth at bedtime. 90 tablet 3 03/07/2024 Morning   propranolol  ER (INDERAL  LA) 60 MG 24 hr capsule Take 1 capsule (60 mg total) by mouth daily. 90 capsule 2 03/07/2024 Morning   cyclobenzaprine  (FLEXERIL ) 10 MG tablet Take 1 tablet (10 mg total) by mouth once as needed for muscle spasms. (Patient not taking: Reported on 03/07/2024) 15 tablet 0 Not Taking    Allergies is allergic to wheat.   OB History OB History  Gravida Para Term Preterm AB Living  2 1 1  0 0 1  SAB IAB Ectopic Multiple Live Births  0 0 0 0 1    # Outcome Date GA Lbr Len/2nd Weight Sex Type Anes PTL Lv  2 Current           1 Term 10/24/20 [redacted]w[redacted]d 16:43 / 01:43 3600 g M Vag-Spont EPI  LIV     Name: Toni Foster     Apgar1: 8  Apgar5: 9    Past Medical History Past Medical History:  Diagnosis Date   Medical history non-contributory  Past Surgical History Past Surgical History:  Procedure Laterality Date   ANTERIOR CRUCIATE LIGAMENT REPAIR     TONSILLECTOMY     WISDOM TOOTH EXTRACTION      Social History  reports that she has never smoked. She has never used smokeless tobacco. She reports that she does not drink alcohol and does not use drugs.   Family History family history includes Alzheimer's disease in her paternal grandfather and paternal grandmother; Anxiety disorder in her brother, father, mother, and sister; Breast cancer (age of onset: 27) in her maternal grandmother; Depression in her brother, father,  mother, and sister; Fibromyalgia in her mother; Healthy in her brother and sister; Migraines in her father; Parkinson's disease in her maternal grandfather.   PHYSICAL EXAM   Vitals:   03/07/24 0815  BP: 120/78  Pulse: 96  Resp: 18  Temp: 97.9 F (36.6 C)  TempSrc: Oral  Weight: 87.4 kg  Height: 5' 2 (1.575 m)    Constitutional: No acute distress, well appearing, and well nourished. Neurologic: She is alert and conversational.  Psychiatric: She has a normal mood and affect.  Musculoskeletal: Normal gait, grossly normal range of motion Cardiovascular: Normal rate.   Pulmonary/Chest: Normal work of breathing.  Gastrointestinal/Abdominal: Soft. Gravid. There is no tenderness.  Skin: Skin is warm and dry. No rash noted.  Genitourinary: Normal external female genitalia.  SVE:   Dilation: 3 Effacement (%): 70 Station: -2 Presentation: Vertex Exam by:: Toni Foster CNM   NST Interpretation  Baseline: 120 bpm Variability: moderate Accelerations: present Decelerations: none Contractions: irregular Impression: reactive   Toni Foster Toni Foster, CNM

## 2024-03-07 NOTE — Anesthesia Preprocedure Evaluation (Signed)
 Anesthesia Evaluation  Patient identified by MRN, date of birth, ID band Patient awake    Reviewed: Allergy & Precautions, NPO status , Patient's Chart, lab work & pertinent test results  History of Anesthesia Complications Negative for: history of anesthetic complications  Airway Mallampati: III  TM Distance: >3 FB Neck ROM: full    Dental no notable dental hx.    Pulmonary neg pulmonary ROS   Pulmonary exam normal        Cardiovascular Exercise Tolerance: Good negative cardio ROS Normal cardiovascular exam     Neuro/Psych  Headaches PSYCHIATRIC DISORDERS Anxiety Depression       GI/Hepatic negative GI ROS,,,  Endo/Other    Renal/GU   negative genitourinary   Musculoskeletal   Abdominal   Peds  Hematology negative hematology ROS (+)   Anesthesia Other Findings Past Medical History: No date: Medical history non-contributory  Past Surgical History: No date: ANTERIOR CRUCIATE LIGAMENT REPAIR No date: TONSILLECTOMY No date: WISDOM TOOTH EXTRACTION  BMI    Body Mass Index: 35.23 kg/m      Reproductive/Obstetrics (+) Pregnancy                              Anesthesia Physical Anesthesia Plan  ASA: 2  Anesthesia Plan: Epidural   Post-op Pain Management:    Induction:   PONV Risk Score and Plan:   Airway Management Planned: Natural Airway  Additional Equipment:   Intra-op Plan:   Post-operative Plan:   Informed Consent: I have reviewed the patients History and Physical, chart, labs and discussed the procedure including the risks, benefits and alternatives for the proposed anesthesia with the patient or authorized representative who has indicated his/her understanding and acceptance.     Dental Advisory Given  Plan Discussed with: Anesthesiologist  Anesthesia Plan Comments: (Patient reports no bleeding problems and no anticoagulant use.   Patient consented for  risks of anesthesia including but not limited to:  - adverse reactions to medications - risk of bleeding, infection and or nerve damage from epidural that could lead to paralysis - risk of headache or failed epidural - nerve damage due to positioning - that if epidural is used for C-section that there is a chance of epidural failure requiring spinal placement or conversion to GA - Damage to heart, brain, lungs, other parts of body or loss of life  Patient voiced understanding and assent.)        Anesthesia Quick Evaluation

## 2024-03-07 NOTE — Progress Notes (Signed)
 Labor Progress Note   ASSESSMENT/PLAN   Toni Foster 33 y.o.   G2P1001  at [redacted]w[redacted]d here for IOL.  FWB:  - Fetal well being assessed: Category 1        GBS: - GBS negative  LABOR: - Active labor, doing well. - Continue to titrate Pitocin  as indicated - Pain Management: Epidural - Anticipate SVD   Labor Progress Cervical Exam  Last 3 exams Dil Eff Sta Exam Time  7 70 -2 2105  5 70 -2 2017  4 70 -2 1821    Principal Problem:   Encounter for induction of labor   SUBJECTIVE/OBJECTIVE   SUBJECTIVE:  Toni Foster is resting comfortably with her epidural.   OBJECTIVE: Vital Signs: Patient Vitals for the past 12 hrs:  BP Temp Temp src Pulse SpO2  03/07/24 2315 -- 99 F (37.2 C) Axillary -- --  03/07/24 2230 118/69 -- -- 77 98 %  03/07/24 2105 -- 98.3 F (36.8 C) Oral -- --  03/07/24 2030 (!) 105/59 -- -- 94 99 %  03/07/24 2017 (!) 104/54 -- -- 90 100 %  03/07/24 2000 (!) 109/56 -- -- 87 100 %  03/07/24 1944 (!) 102/53 -- -- 85 --  03/07/24 1941 (!) 100/52 -- -- 87 --  03/07/24 1935 (!) 91/47 -- -- 89 99 %  03/07/24 1931 (!) 86/70 -- -- 68 --  03/07/24 1930 (!) 79/46 -- -- 70 98 %  03/07/24 1915 108/67 -- -- 97 99 %  03/07/24 1910 109/66 -- -- 95 98 %  03/07/24 1905 110/73 -- -- 94 99 %  03/07/24 1859 123/72 -- -- 84 --  03/07/24 1857 136/86 -- -- 91 99 %  03/07/24 1852 134/78 -- -- 96 --  03/07/24 1502 105/64 -- -- 85 --  03/07/24 1501 -- 98.1 F (36.7 C) Oral -- --    Last SVE:  Dilation: 7 Effacement (%): 70 Cervical Position: Middle Station: -2 Presentation: Vertex Exam by:: RRL -  , Rupture Date: 03/07/24, Rupture Time: 2219,    FHR:   - Baseline: 125 - Variability: moderate - Accels: present - Decels: early Category 1  UTERINE ACTIVITY:  Contractions: q 1.5-4 minutes  Toni Foster, CNM

## 2024-03-08 ENCOUNTER — Other Ambulatory Visit: Payer: Self-pay

## 2024-03-08 ENCOUNTER — Encounter
Admission: EM | Disposition: A | Discharge status: Home / Self Care | Payer: Self-pay | Source: Ambulatory Visit | Attending: Certified Nurse Midwife

## 2024-03-08 ENCOUNTER — Encounter: Payer: Self-pay | Admitting: Advanced Practice Midwife

## 2024-03-08 DIAGNOSIS — Z302 Encounter for sterilization: Secondary | ICD-10-CM

## 2024-03-08 DIAGNOSIS — O480 Post-term pregnancy: Principal | ICD-10-CM

## 2024-03-08 DIAGNOSIS — Z3A40 40 weeks gestation of pregnancy: Secondary | ICD-10-CM

## 2024-03-08 LAB — CORD BLOOD GAS (ARTERIAL)
Acid-base deficit: 2.8 mmol/L — ABNORMAL HIGH (ref 0.0–2.0)
Bicarbonate: 27.1 mmol/L (ref 20.0–28.0)
pCO2 cord blood (arterial): 71 mmHg — ABNORMAL HIGH (ref 42–56)
pH cord blood (arterial): 7.19 — CL (ref 7.21–7.38)

## 2024-03-08 LAB — CORD BLOOD GAS (VENOUS)
Acid-base deficit: 3.4 mmol/L — ABNORMAL HIGH (ref 0.0–2.0)
Bicarbonate: 23.6 mmol/L (ref 20.0–28.0)
Ph Cord Blood (Venous): 7.29 (ref 7.24–7.38)
pCO2 Cord Blood (Venous): 49 mmHg (ref 42–56)

## 2024-03-08 LAB — RPR: RPR Ser Ql: NONREACTIVE

## 2024-03-08 SURGERY — Surgical Case
Anesthesia: Epidural

## 2024-03-08 MED ORDER — ACETAMINOPHEN 325 MG PO TABS
650.0000 mg | ORAL_TABLET | Freq: Four times a day (QID) | ORAL | Status: DC | PRN
  Administered 2024-03-08 – 2024-03-10 (×6): 650 mg via ORAL
  Filled 2024-03-08 (×4): qty 2

## 2024-03-08 MED ORDER — SODIUM CHLORIDE 0.9 % IV SOLN: 12.5000 mg | INTRAVENOUS | Status: DC | PRN

## 2024-03-08 MED ORDER — SOD CITRATE-CITRIC ACID 500-334 MG/5ML PO SOLN
ORAL | Status: AC
  Filled 2024-03-08: qty 15

## 2024-03-08 MED ORDER — SCOPOLAMINE 1 MG/3DAYS TD PT72: 1.0000 | MEDICATED_PATCH | Freq: Once | TRANSDERMAL | Status: DC

## 2024-03-08 MED ORDER — ONDANSETRON HCL 4 MG/2ML IJ SOLN
INTRAMUSCULAR | Status: AC
  Filled 2024-03-08: qty 2

## 2024-03-08 MED ORDER — OXYTOCIN-SODIUM CHLORIDE 30-0.9 UT/500ML-% IV SOLN
INTRAVENOUS | Status: DC | PRN
  Administered 2024-03-08: 200 mL via INTRAVENOUS

## 2024-03-08 MED ORDER — CHLORHEXIDINE GLUCONATE 0.12 % MT SOLN
OROMUCOSAL | Status: AC
Start: 2024-03-08 — End: 2024-03-08
  Filled 2024-03-08: qty 15

## 2024-03-08 MED ORDER — MEPERIDINE HCL 25 MG/ML IJ SOLN: 6.2500 mg | INTRAMUSCULAR | Status: DC | PRN

## 2024-03-08 MED ORDER — OXYCODONE-ACETAMINOPHEN 5-325 MG PO TABS
2.0000 | ORAL_TABLET | ORAL | Status: DC | PRN
  Administered 2024-03-09 (×4): 2 via ORAL
  Filled 2024-03-08 (×3): qty 2

## 2024-03-08 MED ORDER — ONDANSETRON HCL 4 MG/2ML IJ SOLN
INTRAMUSCULAR | Status: DC | PRN
  Administered 2024-03-08: 4 mg via INTRAVENOUS

## 2024-03-08 MED ORDER — OXYCODONE HCL 5 MG PO TABS
5.0000 mg | ORAL_TABLET | Freq: Four times a day (QID) | ORAL | Status: DC | PRN
  Administered 2024-03-08 – 2024-03-09 (×5): 5 mg via ORAL
  Filled 2024-03-08 (×4): qty 1

## 2024-03-08 MED ORDER — DIPHENHYDRAMINE HCL 25 MG PO CAPS: 25.0000 mg | ORAL_CAPSULE | ORAL | Status: DC | PRN

## 2024-03-08 MED ORDER — MIDAZOLAM HCL 2 MG/2ML IJ SOLN
INTRAMUSCULAR | Status: AC
  Filled 2024-03-08: qty 2

## 2024-03-08 MED ORDER — KETOROLAC TROMETHAMINE 30 MG/ML IJ SOLN
30.0000 mg | Freq: Four times a day (QID) | INTRAMUSCULAR | Status: AC | PRN
  Administered 2024-03-08 (×2): 30 mg via INTRAVENOUS
  Filled 2024-03-08 (×2): qty 1

## 2024-03-08 MED ORDER — 0.9 % SODIUM CHLORIDE (POUR BTL) OPTIME
TOPICAL | Status: DC | PRN
  Administered 2024-03-08: 200 mL

## 2024-03-08 MED ORDER — WITCH HAZEL-GLYCERIN EX PADS
1.0000 | MEDICATED_PAD | CUTANEOUS | Status: DC | PRN
Start: 2024-03-08 — End: 2024-03-10
  Filled 2024-03-08: qty 100

## 2024-03-08 MED ORDER — DEXAMETHASONE SODIUM PHOSPHATE 10 MG/ML IJ SOLN
INTRAMUSCULAR | Status: DC | PRN
  Administered 2024-03-08: 10 mg via INTRAVENOUS

## 2024-03-08 MED ORDER — MORPHINE SULFATE (PF) 0.5 MG/ML IJ SOLN
INTRAMUSCULAR | Status: AC
  Filled 2024-03-08: qty 10

## 2024-03-08 MED ORDER — NALOXONE HCL 4 MG/10ML IJ SOLN
1.0000 ug/kg/h | INTRAVENOUS | Status: DC | PRN
  Filled 2024-03-08: qty 5

## 2024-03-08 MED ORDER — LIDOCAINE 2% (20 MG/ML) 5 ML SYRINGE: INTRAMUSCULAR | Status: DC | PRN

## 2024-03-08 MED ORDER — DEXMEDETOMIDINE HCL IN NACL 80 MCG/20ML IV SOLN
INTRAVENOUS | Status: DC | PRN
Start: 2024-03-08 — End: 2024-03-08
  Administered 2024-03-08 (×2): 4 ug via INTRAVENOUS

## 2024-03-08 MED ORDER — IBUPROFEN 600 MG PO TABS
600.0000 mg | ORAL_TABLET | Freq: Four times a day (QID) | ORAL | Status: DC
  Administered 2024-03-09 – 2024-03-10 (×10): 600 mg via ORAL
  Filled 2024-03-08 (×5): qty 1

## 2024-03-08 MED ORDER — OXYCODONE-ACETAMINOPHEN 5-325 MG PO TABS
1.0000 | ORAL_TABLET | ORAL | Status: DC | PRN
  Administered 2024-03-09 – 2024-03-10 (×4): 1 via ORAL
  Filled 2024-03-08 (×2): qty 1

## 2024-03-08 MED ORDER — TRANEXAMIC ACID-NACL 1000-0.7 MG/100ML-% IV SOLN
INTRAVENOUS | Status: AC
  Filled 2024-03-08: qty 100

## 2024-03-08 MED ORDER — BUPIVACAINE 0.25 % ON-Q PUMP DUAL CATH 400 ML
400.0000 mL | INJECTION | Status: DC
  Filled 2024-03-08: qty 400

## 2024-03-08 MED ORDER — DIPHENHYDRAMINE HCL 50 MG/ML IJ SOLN
INTRAMUSCULAR | Status: AC
  Filled 2024-03-08: qty 1

## 2024-03-08 MED ORDER — ONDANSETRON HCL 4 MG/2ML IJ SOLN: 4.0000 mg | Freq: Three times a day (TID) | INTRAMUSCULAR | Status: DC | PRN

## 2024-03-08 MED ORDER — PHENYLEPHRINE HCL (PRESSORS) 10 MG/ML IV SOLN
INTRAVENOUS | Status: DC | PRN
  Administered 2024-03-08: 80 ug via INTRAVENOUS

## 2024-03-08 MED ORDER — OXYTOCIN-SODIUM CHLORIDE 30-0.9 UT/500ML-% IV SOLN
INTRAVENOUS | Status: AC
  Filled 2024-03-08: qty 500

## 2024-03-08 MED ORDER — LIDOCAINE HCL (PF) 2 % IJ SOLN
INTRAMUSCULAR | Status: DC | PRN
  Administered 2024-03-08: 60 mg via EPIDURAL
  Administered 2024-03-08 (×2): 100 mg via EPIDURAL

## 2024-03-08 MED ORDER — ESCITALOPRAM OXALATE 10 MG PO TABS
10.0000 mg | ORAL_TABLET | Freq: Every day | ORAL | Status: DC
  Administered 2024-03-09 – 2024-03-10 (×4): 10 mg via ORAL
  Filled 2024-03-08 (×2): qty 1

## 2024-03-08 MED ORDER — DIBUCAINE (PERIANAL) 1 % EX OINT: 1.0000 | TOPICAL_OINTMENT | CUTANEOUS | Status: DC | PRN

## 2024-03-08 MED ORDER — PHENYLEPHRINE HCL-NACL 20-0.9 MG/250ML-% IV SOLN
INTRAVENOUS | Status: AC
  Filled 2024-03-08: qty 250

## 2024-03-08 MED ORDER — SODIUM CHLORIDE 0.9 % IV SOLN
INTRAVENOUS | Status: AC
  Filled 2024-03-08: qty 5

## 2024-03-08 MED ORDER — KETOROLAC TROMETHAMINE 30 MG/ML IJ SOLN
INTRAMUSCULAR | Status: AC
  Filled 2024-03-08: qty 1

## 2024-03-08 MED ORDER — SIMETHICONE 80 MG PO CHEW
80.0000 mg | CHEWABLE_TABLET | Freq: Three times a day (TID) | ORAL | Status: DC
  Administered 2024-03-08 – 2024-03-10 (×10): 80 mg via ORAL
  Filled 2024-03-08 (×7): qty 1

## 2024-03-08 MED ORDER — SODIUM CHLORIDE 0.9 % IV SOLN
500.0000 mg | INTRAVENOUS | Status: AC
  Administered 2024-03-08: 250 mg via INTRAVENOUS
  Filled 2024-03-08: qty 5

## 2024-03-08 MED ORDER — CEFAZOLIN SODIUM-DEXTROSE 2-4 GM/100ML-% IV SOLN
INTRAVENOUS | Status: AC
  Filled 2024-03-08: qty 100

## 2024-03-08 MED ORDER — MENTHOL 3 MG MT LOZG
1.0000 | LOZENGE | OROMUCOSAL | Status: DC | PRN
Start: 2024-03-08 — End: 2024-03-10

## 2024-03-08 MED ORDER — SENNOSIDES-DOCUSATE SODIUM 8.6-50 MG PO TABS
2.0000 | ORAL_TABLET | ORAL | Status: DC
  Administered 2024-03-08 – 2024-03-09 (×3): 2 via ORAL
  Filled 2024-03-08 (×2): qty 2

## 2024-03-08 MED ORDER — DIPHENHYDRAMINE HCL 25 MG PO CAPS: 25.0000 mg | ORAL_CAPSULE | Freq: Four times a day (QID) | ORAL | Status: DC | PRN

## 2024-03-08 MED ORDER — MIDAZOLAM HCL 2 MG/2ML IJ SOLN
INTRAMUSCULAR | Status: DC | PRN
  Administered 2024-03-08: 2 mg via INTRAVENOUS

## 2024-03-08 MED ORDER — LACTATED RINGERS IV SOLN: INTRAVENOUS | Status: DC

## 2024-03-08 MED ORDER — DEXAMETHASONE SODIUM PHOSPHATE 10 MG/ML IJ SOLN
INTRAMUSCULAR | Status: AC
  Filled 2024-03-08: qty 1

## 2024-03-08 MED ORDER — NALOXONE HCL 0.4 MG/ML IJ SOLN: 0.4000 mg | INTRAMUSCULAR | Status: DC | PRN

## 2024-03-08 MED ORDER — FENTANYL CITRATE (PF) 100 MCG/2ML IJ SOLN
INTRAMUSCULAR | Status: AC
  Administered 2024-03-08: 25 ug via INTRAVENOUS
  Filled 2024-03-08: qty 2

## 2024-03-08 MED ORDER — KETOROLAC TROMETHAMINE 30 MG/ML IJ SOLN
30.0000 mg | Freq: Four times a day (QID) | INTRAMUSCULAR | Status: AC | PRN
Start: 2024-03-08 — End: 2024-03-09

## 2024-03-08 MED ORDER — COCONUT OIL OIL
1.0000 | TOPICAL_OIL | Status: DC | PRN
  Administered 2024-03-09 (×2): 1 via TOPICAL
  Filled 2024-03-08: qty 7.5

## 2024-03-08 MED ORDER — PHENYLEPHRINE 80 MCG/ML (10ML) SYRINGE FOR IV PUSH (FOR BLOOD PRESSURE SUPPORT)
PREFILLED_SYRINGE | INTRAVENOUS | Status: AC
  Filled 2024-03-08: qty 10

## 2024-03-08 MED ORDER — LIDOCAINE HCL (PF) 2 % IJ SOLN
INTRAMUSCULAR | Status: AC
  Filled 2024-03-08: qty 20

## 2024-03-08 MED ORDER — PRENATAL MULTIVITAMIN CH
1.0000 | ORAL_TABLET | Freq: Every day | ORAL | Status: DC
  Administered 2024-03-09 (×2): 1 via ORAL
  Filled 2024-03-08: qty 1

## 2024-03-08 MED ORDER — CEFAZOLIN SODIUM-DEXTROSE 2-4 GM/100ML-% IV SOLN: 2.0000 g | INTRAVENOUS | Status: DC

## 2024-03-08 MED ORDER — METHYLERGONOVINE MALEATE 0.2 MG/ML IJ SOLN
INTRAMUSCULAR | Status: DC | PRN
Start: 2024-03-08 — End: 2024-03-08
  Administered 2024-03-08: .2 mg via INTRAMUSCULAR

## 2024-03-08 MED ORDER — OXYTOCIN-SODIUM CHLORIDE 30-0.9 UT/500ML-% IV SOLN: 2.5000 [IU]/h | INTRAVENOUS | Status: AC

## 2024-03-08 MED ORDER — BUPIVACAINE HCL (PF) 0.5 % IJ SOLN
INTRAMUSCULAR | Status: AC
  Filled 2024-03-08: qty 10

## 2024-03-08 MED ORDER — SOD CITRATE-CITRIC ACID 500-334 MG/5ML PO SOLN
30.0000 mL | ORAL | Status: AC
  Administered 2024-03-08: 30 mL via ORAL

## 2024-03-08 MED ORDER — MORPHINE SULFATE (PF) 0.5 MG/ML IJ SOLN
INTRAMUSCULAR | Status: DC | PRN
  Administered 2024-03-08: 3 mg via EPIDURAL

## 2024-03-08 MED ORDER — PROPRANOLOL HCL ER 60 MG PO CP24
60.0000 mg | ORAL_CAPSULE | Freq: Every day | ORAL | Status: DC
  Administered 2024-03-09 (×2): 60 mg via ORAL
  Filled 2024-03-08 (×3): qty 1

## 2024-03-08 MED ORDER — METHYLERGONOVINE MALEATE 0.2 MG/ML IJ SOLN
INTRAMUSCULAR | Status: AC
  Filled 2024-03-08: qty 1

## 2024-03-08 MED ORDER — DIPHENHYDRAMINE HCL 50 MG/ML IJ SOLN: 12.5000 mg | INTRAMUSCULAR | Status: DC | PRN

## 2024-03-08 MED ORDER — SODIUM CHLORIDE 0.9% FLUSH: 3.0000 mL | INTRAVENOUS | Status: DC | PRN

## 2024-03-08 MED ORDER — FENTANYL CITRATE (PF) 100 MCG/2ML IJ SOLN
25.0000 ug | INTRAMUSCULAR | Status: AC | PRN
  Administered 2024-03-08 (×4): 25 ug via INTRAVENOUS
  Administered 2024-03-08: 50 ug via INTRAVENOUS
  Filled 2024-03-08: qty 2

## 2024-03-08 MED ORDER — FERROUS SULFATE 325 (65 FE) MG PO TABS
325.0000 mg | ORAL_TABLET | Freq: Two times a day (BID) | ORAL | Status: DC
  Administered 2024-03-09 – 2024-03-10 (×6): 325 mg via ORAL
  Filled 2024-03-08 (×4): qty 1

## 2024-03-08 MED ORDER — TRANEXAMIC ACID-NACL 1000-0.7 MG/100ML-% IV SOLN
INTRAVENOUS | Status: DC | PRN
  Administered 2024-03-08: 1000 mg via INTRAVENOUS

## 2024-03-08 MED ORDER — KETOROLAC TROMETHAMINE 30 MG/ML IJ SOLN
INTRAMUSCULAR | Status: DC | PRN
  Administered 2024-03-08: 30 mg via INTRAVENOUS

## 2024-03-08 SURGICAL SUPPLY — 26 items
BENZOIN TINCTURE PRP APPL 2/3 (GAUZE/BANDAGES/DRESSINGS) ×1 IMPLANT
DERMABOND ADVANCED .7 DNX12 (GAUZE/BANDAGES/DRESSINGS) ×1 IMPLANT
DRSG OPSITE POSTOP 4X10 (GAUZE/BANDAGES/DRESSINGS) ×1 IMPLANT
DRSG OPSITE POSTOP 4X12 (GAUZE/BANDAGES/DRESSINGS) IMPLANT
DRSG TELFA 3X8 NADH STRL (GAUZE/BANDAGES/DRESSINGS) ×1 IMPLANT
ELECT CAUTERY BLADE 6.4 (BLADE) ×1 IMPLANT
ELECTRODE REM PT RTRN 9FT ADLT (ELECTROSURGICAL) ×1 IMPLANT
GAUZE SPONGE 4X4 12PLY STRL (GAUZE/BANDAGES/DRESSINGS) ×1 IMPLANT
GLOVE BIO SURGEON STRL SZ7 (GLOVE) ×1 IMPLANT
GLOVE INDICATOR 7.5 STRL GRN (GLOVE) ×1 IMPLANT
GOWN STRL REUS W/ TWL LRG LVL3 (GOWN DISPOSABLE) ×3 IMPLANT
MANIFOLD NEPTUNE II (INSTRUMENTS) ×1 IMPLANT
MAT PREVALON FULL STRYKER (MISCELLANEOUS) ×1 IMPLANT
NS IRRIG 1000ML POUR BTL (IV SOLUTION) ×1 IMPLANT
PACK C SECTION AR (MISCELLANEOUS) ×1 IMPLANT
PAD OB MATERNITY 11 LF (PERSONAL CARE ITEMS) ×2 IMPLANT
PAD PREP OB/GYN DISP 24X41 (PERSONAL CARE ITEMS) ×1 IMPLANT
SCRUB CHG 4% DYNA-HEX 4OZ (MISCELLANEOUS) ×1 IMPLANT
STAPLER INSORB 30 2030 C-SECTI (MISCELLANEOUS) IMPLANT
STRIP CLOSURE SKIN 1/2X4 (GAUZE/BANDAGES/DRESSINGS) ×1 IMPLANT
SUT PDS AB 1 TP1 96 (SUTURE) ×1 IMPLANT
SUT VIC AB 0 CTX36XBRD ANBCTRL (SUTURE) ×2 IMPLANT
SUT VICRYL 3-0 CR8 SH (SUTURE) IMPLANT
SUTURE MNCRL 4-0 27XMF (SUTURE) ×1 IMPLANT
TRAP FLUID SMOKE EVACUATOR (MISCELLANEOUS) ×1 IMPLANT
WATER STERILE IRR 500ML POUR (IV SOLUTION) ×1 IMPLANT

## 2024-03-08 NOTE — Op Note (Signed)
 Cesarean Section Operative Note    Patient Name: Toni Foster  Date of Birth: 1990/08/19  MRN: [id number]  Date of Surgery: 03/08/2024   Pre-operative Diagnosis:  1) Second stage arrest (arrest of descent) 2) Multiparity, desires permanent sterilization 3) intrauterine pregnancy at [redacted]w[redacted]d   Post-operative Diagnosis:  1) Second stage arrest (arrest of descent) 2) Multiparity, desires permanent sterilization 3) intrauterine pregnancy at [redacted]w[redacted]d    Procedure:  1) Primary low transverse cesarean section via pfannenstiel incision with double layer uterine closure 2) Bilateral tubal ligation with modified Corinna method   Surgeon: Surgeons and Role:    * Leonce Garnette BIRCH, MD - Primary   Assistants: [name] Canny; No other capable assistant available, in surgery requiring high level assistant.  Anesthesia: epidural   Findings:  1) normal appearing gravid uterus, fallopian tubes, and ovaries 2) viable female infant with weight 4,250 grams, APGARs 1, 7, and 9   Quantified Blood Loss: 890 mL  Total IV Fluids: 800 ml   Urine Output: 50 mL  Specimens: portion of right and left fallopian tubes  Complications: no complications  Disposition: PACU - hemodynamically stable.   Maternal Condition: stable   Baby condition / location:  Couplet care / Skin to Skin  Procedure Details:  The patient was seen in the Holding Room. The risks, benefits, complications, treatment options, and expected outcomes were discussed with the patient. The patient concurred with the proposed plan, giving informed consent. identified as AERILYNN GOIN and the procedure verified as C-Section Delivery. A Time Out was held and the above information confirmed.   After induction of anesthesia, the patient was prepped and draped in the usual sterile manner. A Pfannenstiel incision was made and carried down through the subcutaneous tissue to the fascia. Fascial incision was made and extended transversely. The  fascia was separated from the underlying rectus tissue superiorly and inferiorly. The peritoneum was identified and entered. Peritoneal incision was extended longitudinally. The bladder flap was not bluntly or sharply freed from the lower uterine segment. A low transverse uterine incision was made and the hysterotomy was extended with cranial-caudal tension. Delivered from cephalic presentation was a 4,250 gram Living newborn infant(s) or Female with Apgar scores of 1 at one minute and 7 at five minutes, and 9 at 10 minutes. Cord ph was sent the umbilical cord was clamped and cut cord blood was not obtained for evaluation. The placenta was removed Intact and appeared normal. The uterine outline, tubes and ovaries appeared normal.  The fallopian tubes were mildly adherent to the ovaries. The uterine incision was closed with running locked sutures of 0 Vicryl.  A second layer of the same suture was thrown in an imbricating fashion.  Hemostasis was assured.    The tubal ligation portion of the procedure was performed at this point.  The left fallopian tube was identified and followed out to the fimbriated end.  A Babcock clamp was used to grasp the tube in the mid-isthmic portion and two 2-0 plain gut sutures were used to ligate the tube.  An approximately 3cm segment of tube was removed with hemostasis assured.  The same procedure was performed on the right fallopian tube with hemostasis noted.   The uterus was returned to the abdomen and the paracolic gutters were cleared of all clots and debris.  The rectus muscles were inspected and found to be hemostatic.  The fascia was then reapproximated with running sutures of 1-0 PDS, looped. The subcutaneous tissue was closed using 3-0  Vicryl to reduce tension on the skin closure and to close dead space. The subcuticular closure was performed using 4-0 monocryl. The skin closure was reinforced using surgical skin glue.  The surgical assistant performed tissue  retraction, assistance with suturing, and fundal pressure.  Instrument, sponge, and needle counts were correct prior the abdominal closure and were correct at the conclusion of the case.  The patient received Ancef  2 gram IV prior to skin incision (within 30 minutes). For VTE prophylaxis she was wearing SCDs throughout the case.  The assistant surgeon was a CNM due to lack of availability of another Sales promotion account executive.    Signed: Garnette CHARM Mace, MD 03/08/2024 8:36 AM

## 2024-03-08 NOTE — Progress Notes (Signed)
 Labor Progress Note   ASSESSMENT/PLAN   RENE GONSOULIN 33 y.o.   [id number]  at [redacted]w[redacted]d here for IOL.  FWB:  - Fetal well being assessed: Category 2        GBS: - GBS negative  LABOR: -  Second stage, arrest of descent - Dr. Leonce to bedside to discuss options with Ceanna and her support people (see his note). Plan to proceed with cesarean birth - Pain Management: Epidural  Labor Progress Pushing for two hours, minimal descent  SUBJECTIVE/OBJECTIVE   SUBJECTIVE:  Venba has been pushing now for 2 hours in multiple positions with good effort and Pitocin  titration. Epidural turned down  without an improvement in fetal movement with pushing. Caput has increased with only minimal change in fetal station. Significant perineal/labial swelling noted. Not a good candidate for vacuum delivery. Suspect ROP.  OBJECTIVE: Vital Signs: Patient Vitals for the past 12 hrs:  BP Temp Temp src Pulse Resp SpO2  03/08/24 0918 136/73 -- -- 70 17 97 %  03/08/24 0903 125/76 -- -- 77 19 98 %  03/08/24 0531 118/68 -- -- 82 -- --  03/08/24 0130 (!) 101/55 98.7 F (37.1 C) Axillary 94 14 100 %  03/08/24 0030 124/82 -- -- 87 -- 100 %  03/07/24 2315 -- 99 F (37.2 C) Axillary -- -- --  03/07/24 2230 118/69 -- -- 77 -- 98 %    Last SVE:  Dilation: 10 Effacement (%): 100 Cervical Position: Middle Station: 0 Presentation: Vertex Exam by:: EMERSON Canny, CNM -  , Rupture Date: 03/07/24, Rupture Time: 2219,    FHR:   - Baseline: 130 - Variability: minimal/moderate - Accels: present - Decels: late, early, variable Category 2  UTERINE ACTIVITY:  Contractions: q 2-3 minutes  [name] CHRISTELLA Canny, CNM

## 2024-03-08 NOTE — Progress Notes (Signed)
 Labor Progress Note   ASSESSMENT/PLAN   Toni Foster 33 y.o.   G2P1001  at [redacted]w[redacted]d here for IOL.  FWB:  - Fetal well being assessed: Category 2. Occasional variable decels. Late decels and a prolonged decel between 0045-0005 which resolved with conservative measures. None since that time.        GBS: - GBS negative  LABOR: -  Active labor, doing well. - Rim of cervix anterior/maternal right -  Restart Pitocin  and change positions to encourage dilation - Pain Management: Epidural - Anticipate SVD   Labor Progress Cervical Exam  Last 3 exams Dil Eff Sta Exam Time  9.5 100 -1 0240  9 100 -1 0130  8.5 90 -1 0008    Principal Problem:   Encounter for induction of labor   SUBJECTIVE/OBJECTIVE   SUBJECTIVE:  Toni Foster is feeling pressure that comes and goes with contractions.    OBJECTIVE: Vital Signs: Patient Vitals for the past 12 hrs:  BP Temp Temp src Pulse Resp SpO2  03/08/24 0130 (!) 101/55 98.7 F (37.1 C) Axillary 94 14 100 %  03/08/24 0030 124/82 -- -- 87 -- 100 %  03/07/24 2315 -- 99 F (37.2 C) Axillary -- -- --  03/07/24 2230 118/69 -- -- 77 -- 98 %  03/07/24 2105 -- 98.3 F (36.8 C) Oral -- -- --  03/07/24 2030 (!) 105/59 -- -- 94 -- 99 %  03/07/24 2017 (!) 104/54 -- -- 90 -- 100 %  03/07/24 2000 (!) 109/56 -- -- 87 -- 100 %  03/07/24 1944 (!) 102/53 -- -- 85 -- --  03/07/24 1941 (!) 100/52 -- -- 87 -- --  03/07/24 1935 (!) 91/47 -- -- 89 -- 99 %  03/07/24 1931 (!) 86/70 -- -- 68 -- --  03/07/24 1930 (!) 79/46 -- -- 70 -- 98 %  03/07/24 1915 108/67 -- -- 97 -- 99 %  03/07/24 1910 109/66 -- -- 95 -- 98 %  03/07/24 1905 110/73 -- -- 94 -- 99 %  03/07/24 1859 123/72 -- -- 84 -- --  03/07/24 1857 136/86 -- -- 91 -- 99 %  03/07/24 1852 134/78 -- -- 96 -- --  03/07/24 1502 105/64 -- -- 85 -- --  03/07/24 1501 -- 98.1 F (36.7 C) Oral -- -- --    Last SVE:  9.5/100/-1   FHR:   - Baseline: 135 - Variability:  moderate - Accels: present - Decels:  occasional variables, late, prolonged (see above) Category 2  UTERINE ACTIVITY:  Contractions: q 2-3 minutes  [name] CHRISTELLA Canny, CNM

## 2024-03-08 NOTE — Progress Notes (Signed)
 Labor Progress Note   ASSESSMENT/PLAN   Toni Foster 33 y.o.   G2P1001  at [redacted]w[redacted]d here for IOL.  FWB:  - Fetal well being assessed: Category 2        GBS: - GBS negative  LABOR: - Second stage of labor, making slow descent  - Discussed options with Toni Foster. Will continue to push with frequent position changes. Titrate Pitocin  as needed. - Pain Management: Epidural - Anticipate SVD   Labor Progress Pushing with good effort for approximately one hour  Principal Problem:   Encounter for induction of labor   SUBJECTIVE/OBJECTIVE   SUBJECTIVE:  Toni Foster was feeling intense rectal pressure and urge to push. Cervical exam showed an anterior lip of cervix. Over several pushes, I was able to reduce the cervix and fetal descent was noted. Dr. Leonce was called to the bedside d/t to episode of bradycardia with pushing and concern for possible need for operative delivery. Bradycardia resolved with fluid bolus and position changes.  OBJECTIVE: Vital Signs: Patient Vitals for the past 12 hrs:  BP Temp Temp src Pulse Resp SpO2  03/08/24 0130 (!) 101/55 98.7 F (37.1 C) Axillary 94 14 100 %  03/08/24 0030 124/82 -- -- 87 -- 100 %  03/07/24 2315 -- 99 F (37.2 C) Axillary -- -- --  03/07/24 2230 118/69 -- -- 77 -- 98 %  03/07/24 2105 -- 98.3 F (36.8 C) Oral -- -- --  03/07/24 2030 (!) 105/59 -- -- 94 -- 99 %  03/07/24 2017 (!) 104/54 -- -- 90 -- 100 %  03/07/24 2000 (!) 109/56 -- -- 87 -- 100 %  03/07/24 1944 (!) 102/53 -- -- 85 -- --  03/07/24 1941 (!) 100/52 -- -- 87 -- --  03/07/24 1935 (!) 91/47 -- -- 89 -- 99 %  03/07/24 1931 (!) 86/70 -- -- 68 -- --  03/07/24 1930 (!) 79/46 -- -- 70 -- 98 %  03/07/24 1915 108/67 -- -- 97 -- 99 %  03/07/24 1910 109/66 -- -- 95 -- 98 %  03/07/24 1905 110/73 -- -- 94 -- 99 %  03/07/24 1859 123/72 -- -- 84 -- --  03/07/24 1857 136/86 -- -- 91 -- 99 %  03/07/24 1852 134/78 -- -- 96 -- --    Last SVE:  Dilation: 10 Dilation Complete Date:  03/08/24 Dilation Complete Time: 0404 Effacement (%): 100 Cervical Position: Middle Station: -1 Presentation: Vertex Exam by:: EMERSON Canny, CNM -  , Rupture Date: 03/07/24, Rupture Time: 2219,    FHR:   - Baseline: 125 - Variability: minimal, moderate - Accels: present - Decels: early, late, variable Category 2  UTERINE ACTIVITY:  Contractions: q 2-4 minutes  Toni Foster CHRISTELLA Canny, CNM

## 2024-03-08 NOTE — Progress Notes (Addendum)
 I was called by the CNM to assess the patient and did so about 1.5 hours ago.  She was about 0 station with mild caput forming. Fetus in ROP presentation.  She has pushed an additional 1.5 hours and there has been no noticeable change in station of the bony portion of the fetal head. There is increase caput and maternal swelling, fetus still in ROP presentation.   She has had attempts at positioning changes and maneuvers to improve chance of fetal descent and possible head rotation.  These appear to have been unsuccessful.  Discussed the options with the patient at this point. She could try to continue pushing. This may be successful if the head is asynclitic and changes to a more favorable position.  The head doesn't feel particularly asynclitic, but a small adjustment could help. Discussed that she could continue pushing and no change could occur.  She voiced that she was exhausted and ready to be done with the process. She wants to move forward with cesarean delivery.  I personally consented the patient and all questions were answered.   33 y.o. G2P1001  with undesired fertility, desires permanent sterilization.  Other reversible forms of contraception were discussed with patient; she declines all other modalities. Permanent nature of as well as associated risks of the procedure discussed with patient including but not limited to: risk of regret, permanence of method, bleeding, infection, injury to surrounding organs and need for additional procedures.  Failure risk of 0.5-1% with increased risk of ectopic gestation if pregnancy occurs was also discussed with patient.    She would like to have this done.  This was added to the consent.   Female chaperone present for pelvic exam:   Garnette Mace, MD, [facility] 03/08/2024 6:32 AM

## 2024-03-08 NOTE — Transfer of Care (Signed)
 Immediate Anesthesia Transfer of Care Note  Patient: Toni Foster  Procedure(s) Performed: CESAREAN DELIVERY  Patient Location: LDR4  Anesthesia Type:Epidural  Level of Consciousness: awake, alert , and oriented  Airway & Oxygen Therapy: Patient Spontanous Breathing  Post-op Assessment: Report given to RN and Post -op Vital signs reviewed and stable  Post vital signs: Reviewed and stable  Last Vitals:  Vitals Value Taken Time  BP 112/77   Temp    Pulse    Resp    SpO2 98     Last Pain:  Vitals:   03/08/24 0130  TempSrc: Axillary  PainSc:       Patients Stated Pain Goal: 0 (03/07/24 1501)  Complications: No notable events documented.

## 2024-03-08 NOTE — Discharge Summary (Signed)
 Postpartum Discharge Summary  Date of Service updated***     Patient Name: Toni Foster DOB: [birth year] MRN: [id number]  Date of admission: 03/07/2024 Delivery date:03/08/2024 Delivering provider: JACKSON, STEPHEN D Date of discharge: 03/08/2024  Admitting diagnosis: Encounter for induction of labor [Z34.90] Intrauterine pregnancy: [redacted]w[redacted]d     Secondary diagnosis:  Principal Problem:   Encounter for induction of labor Active Problems:   Supervision of other normal pregnancy, antepartum   History of postpartum depression, currently pregnant   [redacted] weeks gestation of pregnancy  Additional problems: ***    Discharge diagnosis: Term Pregnancy Delivered                                              Post partum procedures:postpartum tubal ligation Augmentation: AROM and Pitocin  Complications: Arrest of descent  Hospital course: Induction of Labor With Cesarean Section   33 y.o. yo [id number] at [redacted]w[redacted]d was admitted to the hospital 03/07/2024 for induction of labor. Patient had a labor course significant for arrest of descent. The patient went for cesarean section due to Arrest of Descent. Delivery details are as follows: Membrane Rupture Time/Date: 10:19 PM,03/07/2024  Delivery Method:C-Section, Low Transverse Operative Delivery:N/A Details of operation can be found in separate operative Note.  Patient had a postpartum course complicated by***. She is ambulating, tolerating a regular diet, passing flatus, and urinating well.  Patient is discharged home in stable condition on 03/08/24.      Newborn Data: Birth date:03/08/2024 Birth time:7:34 AM Gender:Female Living status:Living Apgars:1 ,7  Weight:4250 g                               Magnesium Sulfate received: No BMZ received: No Rhophylac:N/A MMR:N/A T-DaP:Given prenatally Flu: No RSV Vaccine received: No Transfusion:{Transfusion received:30440034} Immunizations administered: Immunization History  Administered Date(s)  Administered   Influenza Inj Mdck Quad Pf 05/16/2018, 04/28/2019, 05/03/2022   Influenza,inj,Quad PF,6+ Mos 08/01/2020   Tdap 08/18/2020, 12/13/2023    Physical exam  Vitals:   03/07/24 2315 03/08/24 0030 03/08/24 0130 03/08/24 0531  BP:  124/82 (!) 101/55 118/68  Pulse:  87 94 82  Resp:   14   Temp: 99 F (37.2 C)  98.7 F (37.1 C)   TempSrc: Axillary  Axillary   SpO2:  100% 100%   Weight:      Height:       General: {Exam; general:21111117} Lochia: {Desc; appropriate/inappropriate:30686::appropriate} Uterine Fundus: {Desc; firm/soft:30687} Incision: {Exam; incision:21111123} DVT Evaluation: {Exam; dvt:2111122} Labs: Lab Results  Component Value Date   WBC 9.3 03/07/2024   HGB 11.6 (L) 03/07/2024   HCT 35.6 (L) 03/07/2024   MCV 94.7 03/07/2024   PLT 267 03/07/2024      Latest Ref Rng & Units 10/05/2023    9:22 AM  CMP  Glucose 70 - 99 mg/dL 88   BUN 6 - 20 mg/dL 7   Creatinine 9.55 - 8.99 mg/dL 9.43   Sodium 864 - 854 mmol/L 137   Potassium 3.5 - 5.1 mmol/L 3.6   Chloride 98 - 111 mmol/L 107   CO2 22 - 32 mmol/L 23   Calcium  8.9 - 10.3 mg/dL 8.6   Total Protein 6.5 - 8.1 g/dL 6.5   Total Bilirubin 0.0 - 1.2 mg/dL 0.6   Alkaline Phos 38 - 126 U/L 51  AST 15 - 41 U/L 18   ALT 0 - 44 U/L 13    Edinburgh Score:    10/24/2020   10:00 PM  Edinburgh Postnatal Depression Scale Screening Tool  I have been able to laugh and see the funny side of things. 0  I have looked forward with enjoyment to things. 1  I have blamed myself unnecessarily when things went wrong. 0  I have been anxious or worried for no good reason. 1  I have felt scared or panicky for no good reason. 0  Things have been getting on top of me. 1  I have been so unhappy that I have had difficulty sleeping. 0  I have felt sad or miserable. 1  I have been so unhappy that I have been crying. 1  The thought of harming myself has occurred to me. 0  Edinburgh Postnatal Depression Scale Total 5       Data saved with a previous flowsheet row definition      After visit meds:  Allergies as of 03/08/2024       Reactions   Wheat Other (See Comments)     Med Rec must be completed prior to using this [facility]***        Discharge home in stable condition Infant Feeding: {Baby feeding:23562} Infant Disposition:{CHL IP OB HOME WITH FNUYZM:76418} Discharge instruction: per After Visit Summary and Postpartum booklet. Activity: Advance as tolerated. Pelvic rest for 6 weeks.  Diet: {OB diet:21111121} Anticipated Birth Control: {Birth Control:23956} Postpartum Appointment:{Outpatient follow up:23559} Additional Postpartum F/U: {PP Procedure:23957} Future Appointments:No future appointments. Follow up Visit:      03/08/2024 [name] CHRISTELLA Canny, CNM

## 2024-03-09 ENCOUNTER — Encounter: Admitting: Obstetrics

## 2024-03-09 ENCOUNTER — Encounter: Payer: Self-pay | Admitting: Obstetrics and Gynecology

## 2024-03-09 LAB — CBC
HCT: 33 % — ABNORMAL LOW (ref 36.0–46.0)
Hemoglobin: 10.8 g/dL — ABNORMAL LOW (ref 12.0–15.0)
MCH: 31.9 pg (ref 26.0–34.0)
MCHC: 32.7 g/dL (ref 30.0–36.0)
MCV: 97.3 fL (ref 80.0–100.0)
Platelets: 214 K/uL (ref 150–400)
RBC: 3.39 MIL/uL — ABNORMAL LOW (ref 3.87–5.11)
RDW: 14.5 % (ref 11.5–15.5)
WBC: 18.7 K/uL — ABNORMAL HIGH (ref 4.0–10.5)
nRBC: 0 % (ref 0.0–0.2)

## 2024-03-09 NOTE — Progress Notes (Signed)
 Subjective:   Toni Foster had a c/s on 03/08/24 for failure to descent, also had a BTL at that time. Has had routine postpartum care.  Pt. Is eating, hydrating, and voiding regularly without difficulty. Has yet to have BM but has had flatulence. She is feeding her baby a combination of a friends surplus expressed breastmilk and hospital provided donor milk. Lactation just gave her a breast pump and after eating she plans to pump for the first time. At home she plans to give her own expressed breastmilk through a bottle. States she had anxiety directly nursing her first child not knowing the exact amounts her baby was taking, for her own mental health she prefers to pump exclusively with this baby. Reports small amount of vaginal bleeding, denies passing large blood clots. Has had incisional burning pain and some general abdominal soreness, partially relieved with toradol  and percocet. Denies anxiety/depression symptoms. Endorses good support from partner and family. Sister and partner currently at bedside with Toni Foster. Had BTL with c/s for contraception.   Objective:  Vital signs in last 24 hours: Temp:  [97.7 F (36.5 C)-98.8 F (37.1 C)] 98.6 F (37 C) (08/11 0850) Pulse Rate:  [70-91] 80 (08/11 0850) Resp:  [15-20] 16 (08/11 0850) BP: (105-129)/(69-85) 118/85 (08/11 0850) SpO2:  [95 %-100 %] 99 % (08/11 0850)    General: NAD Pulmonary: no increased work of breathing Breasts: soft, non-tender, nipples without breakdown Abdomen: soft, non-tender Fundus: firm, midline, at umbilicus Incision: honeycomb dressing intact without drainage, no erythema or edema, wound margins appear approximated Lochia: light rubra, no clots Perineum: no erythema or foul odor discharge, minimal edema, laceration well approximated  Extremities: no edema, no erythema, no tenderness  Results for orders placed or performed during the hospital encounter of 03/07/24 (from the past 72 hours)  CBC     Status:  Abnormal   Collection Time: 03/07/24  8:30 AM  Result Value Ref Range   WBC 9.3 4.0 - 10.5 K/uL   RBC 3.76 (L) 3.87 - 5.11 MIL/uL   Hemoglobin 11.6 (L) 12.0 - 15.0 g/dL   HCT 64.3 (L) 63.9 - 53.9 %   MCV 94.7 80.0 - 100.0 fL   MCH 30.9 26.0 - 34.0 pg   MCHC 32.6 30.0 - 36.0 g/dL   RDW 85.3 88.4 - 84.4 %   Platelets 267 150 - 400 K/uL   nRBC 0.0 0.0 - 0.2 %    Comment: Performed at [facility], [address] [place], [place] [address]  Type and screen     Status: None   Collection Time: 03/07/24  8:30 AM  Result Value Ref Range   ABO/RH(D) A POS    Antibody Screen NEG    Sample Expiration      03/10/2024,2359 Performed at [facility], [address] [place], [place] [address]   RPR     Status: None   Collection Time: 03/07/24  8:30 AM  Result Value Ref Range   RPR Ser Ql NON REACTIVE NON REACTIVE    Comment: Performed at [facility], [address] [place], [place] [address]  Cord Blood Gas (Arterial)     Status: Abnormal   Collection Time: 03/08/24  7:46 AM  Result Value Ref Range   pH cord blood (arterial) 7.19 (LL) 7.21 - 7.38    Comment: CRITICAL RESULT CALLED TO, READ BACK BY AND VERIFIED WITH:  JAMIE COBLE RN AT 758 ON [id number] BY TP  pCO2 cord blood (arterial) 71 (H) 42 - 56 mmHg    Comment: CRITICAL RESULT CALLED TO, READ BACK BY AND VERIFIED WITH:  JAMIE COBLE RN AT 758 ON [id number] BY TP    Bicarbonate 27.1 20.0 - 28.0 mmol/L   Acid-base deficit 2.8 (H) 0.0 - 2.0 mmol/L    Comment: Performed at [facility], [address] [place], [place] [address]  Cord Blood Gas (Venous)     Status: Abnormal   Collection Time: 03/08/24  7:47 AM  Result Value Ref Range   Ph Cord Blood (Venous) 7.29 7.24 - 7.38   pCO2 Cord Blood (Venous) 49 42 - 56 mm[Hg]   Bicarbonate 23.6 20.0 - 28.0 mmol/L   Acid-base deficit 3.4 (H) 0.0 - 2.0 mmol/L    Comment: Performed at [facility], [address] [place], [place] [address]   CBC     Status: Abnormal   Collection Time: 03/09/24  5:28 AM  Result Value Ref Range   WBC 18.7 (H) 4.0 - 10.5 K/uL   RBC 3.39 (L) 3.87 - 5.11 MIL/uL   Hemoglobin 10.8 (L) 12.0 - 15.0 g/dL   HCT 66.9 (L) 63.9 - 53.9 %   MCV 97.3 80.0 - 100.0 fL   MCH 31.9 26.0 - 34.0 pg   MCHC 32.7 30.0 - 36.0 g/dL   RDW 85.4 88.4 - 84.4 %   Platelets 214 150 - 400 K/uL   nRBC 0.0 0.0 - 0.2 %    Comment: Performed at [facility], [address], [place], [place] [address]    Assessment:   33 y.o. [id number] 1 day  s/p primary c/s with BTL Exclusively pumping Anemia secondary to acute blood loss- hemodynamically stable and asymptomatic VSS Pain well controlled  Plan:    PO Fe Blood Type --/--/A POS (08/09 0830) / Rubella 2.65 (02/06 1505) / Varicella Immune Rhogam not indicated Tdap/varicella/rubella to be offered before discharge if indicated Feeding plan EBM (combination of donor and patient's), lactation support Encouraged to continue pumping, cluster feeding, hunger cues, lactogenesis II, milk supply Education given regarding options for contraception, as well as compatibility with breast feeding if applicable.  Patient plans on tubal ligation for contraception. Continued routine postpartum care  Counseled on normal uterine involution and vaginal bleeding postpartum Anticipate discharge home tomorrow    Toni Loots, FNP, CNM [facility] 03/09/2024, 10:52 AM

## 2024-03-09 NOTE — Anesthesia Postprocedure Evaluation (Signed)
 Anesthesia Post Note  Patient: Toni Foster  Procedure(s) Performed: CESAREAN DELIVERY  Patient location during evaluation: Mother Baby Anesthesia Type: Epidural Level of consciousness: awake and alert Pain management: pain level controlled Vital Signs Assessment: post-procedure vital signs reviewed and stable Respiratory status: spontaneous breathing, nonlabored ventilation and respiratory function stable Cardiovascular status: stable Postop Assessment: no headache, no backache and epidural receding Anesthetic complications: no   No notable events documented.   Last Vitals:  Vitals:   03/09/24 0850 03/09/24 1545  BP: 118/85 116/80  Pulse: 80 78  Resp: 16 18  Temp: 37 C 36.6 C  SpO2: 99%     Last Pain:  Vitals:   03/09/24 1545  TempSrc: Oral  PainSc:                  Madalyn Hammock

## 2024-03-09 NOTE — Lactation Note (Signed)
 This note was copied from a baby's chart. Lactation Consultation Note  Patient Name: Toni Foster Unijb'd Date: 03/09/2024 Age:31 hours Reason for consult: Follow-up assessment;Exclusive pumping and bottle feeding;Term   Maternal Data Follow up assessment w/ a 26hr old baby Toni and family.  Patient stated that her feeding goal is to exclusively breastfeed.  She stated that her nipples were a little sore and she wanted to get started with pumping.  Feeding Mother's Current Feeding Choice: Breast Milk and Donor Milk Nipple Type: Slow - flow  Lactation Tools Discussed/Used Tools: Pump Breast pump type: Double-Electric Breast Pump Pump Education: Setup, frequency, and cleaning;Milk Storage Reason for Pumping: Exclusive Pumper Pumping frequency: 8 - 12x w/in a 24hr period  LC set up the DEBP for patient, but did not start her pumping just yet.  Encouraged mom to call out if she has questions or needs any assistance with flange sizing.  Interventions Interventions: Breast feeding basics reviewed;DEBP;Education;CDC milk storage guidelines  Reviewed with parents how often mom should be pumping.  Provided parents w/ a CDC Milk storage Guidelines.   Discharge Pump: DEBP;Personal  Consult Status Consult Status: Follow-up Follow-up type: In-patient    [name] 03/09/2024, 11:02 AM

## 2024-03-10 LAB — SURGICAL PATHOLOGY

## 2024-03-10 MED ORDER — ESCITALOPRAM OXALATE 20 MG PO TABS: 20.0000 mg | ORAL_TABLET | Freq: Every day | ORAL | 3 refills | Status: AC

## 2024-03-10 MED ORDER — SENNOSIDES-DOCUSATE SODIUM 8.6-50 MG PO TABS: 2.0000 | ORAL_TABLET | ORAL | 0 refills | Status: AC

## 2024-03-10 MED ORDER — OXYCODONE HCL 5 MG PO TABS: 5.0000 mg | ORAL_TABLET | Freq: Four times a day (QID) | ORAL | 0 refills | Status: AC | PRN

## 2024-03-10 MED ORDER — IBUPROFEN 600 MG PO TABS: 600.0000 mg | ORAL_TABLET | Freq: Four times a day (QID) | ORAL | 0 refills | Status: AC

## 2024-03-10 MED ORDER — ACETAMINOPHEN 325 MG PO TABS: 650.0000 mg | ORAL_TABLET | Freq: Four times a day (QID) | ORAL | 0 refills | Status: AC | PRN

## 2024-03-10 MED ORDER — LIDOCAINE 5 % EX PTCH: 1.0000 | MEDICATED_PATCH | CUTANEOUS | 0 refills | Status: AC

## 2024-03-10 MED ORDER — LIDOCAINE 5 % EX PTCH
1.0000 | MEDICATED_PATCH | CUTANEOUS | Status: DC
  Administered 2024-03-10 (×2): 1 via TRANSDERMAL
  Filled 2024-03-10: qty 1

## 2024-03-10 NOTE — Progress Notes (Signed)
 Discharge instructions given and reviewed with pt and family. Pt educated on follow up care, appointments and when to notify provider, questions invited and answered. ID bands matched with infant.  Pt and family noted understanding  to teaching/education.Pt escorted off unit by volunteer in wheelchair with infant safely in car seat and held by father. Infant secured in car seat by family.no distress noted.

## 2024-03-10 NOTE — Lactation Note (Signed)
 This note was copied from a baby's chart. Lactation Consultation Note  Patient Name: Toni Foster Unijb'd Date: 03/10/2024 Age:32 hours Reason for consult: Follow-up assessment;Exclusive pumping and bottle feeding;Other (Comment) (Discharge Education)   Maternal Data Follow up assessment and discharge education w/ infant and parents.  Patient stated that pumping was going well.  She is seeing milk in one breast but not the other. Mom stated that she is pumping everytime infant receives a bottle of donor milk.  Feeding Mother's Current Feeding Choice: Breast Milk and Donor Milk Nipple Type: Slow - flow  Interventions Interventions: Education  Discharge Discharge Education: Engorgement and breast care;Warning signs for feeding baby;Outpatient recommendation  Education provided on engorgement prevention/treatment.  Gave pumping tips on massaging while pumping to assist w/ milk flow.  Outpatient lactation number provided on white board in room.   Consult Status Consult Status: Complete Follow-up type: Call as needed    Ricky RAMAN Refugio Mcconico 03/10/2024, 10:51 AM

## 2024-03-10 NOTE — Discharge Instructions (Signed)

## 2024-03-10 NOTE — Final Progress Note (Signed)
 Subjective: Postpartum Day 2: Cesarean Delivery  Toni Foster is feeling well overall. Her incision burns but her pain is otherwise well-controlled. She is feeling some increased anxiety. She is ambulating a little. She is voiding, and tolerating POs without difficulty. Her bleeding is WNL. She is pumping and feeding donor milk.    Objective: Vital signs in last 24 hours: Temp:  [97.8 F (36.6 C)-97.9 F (36.6 C)] 97.8 F (36.6 C) (08/12 0759) Pulse Rate:  [73-79] 73 (08/12 0759) Resp:  [18-19] 19 (08/12 0759) BP: (116-123)/(79-89) 123/89 (08/12 0759) SpO2:  [99 %-100 %] 100 % (08/12 0759)  Physical Exam:  General: alert, cooperative, and appears stated age Lochia: appropriate Uterine Fundus: firm Incision: healing well, dressing C/D/I DVT Evaluation: No evidence of DVT seen on physical exam.  Recent Labs    03/09/24 0528  HGB 10.8*  HCT 33.0*    Assessment/Plan: Status post Cesarean section. Doing well postoperatively.  Lidocaine  patch for incision Abdominal binder Increase Lexapro  to 20 mg  Plan to discharge today if able to ambulate around nursing station Discharge instructions reviewed Incision check in one week, video visit/mood check in 2 weeks, office visit in 6 weeks Contraception: BTL done with CS  Eily Louvier M Shelbia Scinto, CNM 03/10/2024, 10:09 AM

## 2024-03-15 ENCOUNTER — Encounter: Payer: Self-pay | Admitting: Obstetrics and Gynecology

## 2024-03-18 ENCOUNTER — Ambulatory Visit (INDEPENDENT_AMBULATORY_CARE_PROVIDER_SITE_OTHER): Admitting: Certified Nurse Midwife

## 2024-03-18 DIAGNOSIS — Z1332 Encounter for screening for maternal depression: Secondary | ICD-10-CM

## 2024-03-18 NOTE — Progress Notes (Signed)
    OBSTETRICS/GYNECOLOGY POST-OPERATIVE CLINIC VISIT  Subjective:     Toni Foster is a 33 y.o. female who presents to the clinic 1 weeks status post CESAREAN DELIVERY for Unscheduled Cesarean Section with bilateral tubal ligation . Eating a regular diet without difficulty. Bowel movements are normal. Pain is controlled with current analgesics. Medications being used: acetaminophen , ibuprofen  (OTC),. She has some mild pain at her incision site. She is pumping/formula feeding baby with Enfamil.   The following portions of the patient's history were reviewed and updated as appropriate: allergies, current medications, past family history, past medical history, past social history, past surgical history, and problem list.  Review of Systems Pertinent items are noted in HPI.   Objective:   BP 116/87   Pulse 82   Resp 16   Ht 5' 2 (1.575 m)   Wt 119 lb 4.8 oz (54.1 kg)   LMP 05/30/2023   Breastfeeding Yes   BMI 21.82 kg/m  Body mass index is 21.82 kg/m.  General:  alert and no distress  Abdomen: soft, bowel sounds active, non-tender  Incision:   healing well, no drainage, no erythema, no hernia, no seroma, no swelling, no dehiscence, incision well approximated    Pathology:   Accession #: [id number] Patient Name: Toni Foster Visit # : [id number]  MRN: [id number] Physician: Leonce Senior DOB/Age [birth year] (Age: 23) Gender: F Collected Date: 03/08/2024 Received Date: 03/09/2024  FINAL DIAGNOSIS      1. Fallopian tube, left, segment :      BENIGN SEGMENT OF FALLOPIAN TUBE, COMPLETELY TRANSECTED       2. Fallopian tube, right, segment :      BENIGN SEGMENT OF FALLOPIAN TUBE, COMPLETELY TRANSECTED  ELECTRONIC SIGNATURE : [place]les[place] Md, Fred , Sports administrator, International aid/development worker  MICROSCOPIC DESCRIPTION  CASE COMMENTS STAINS USED IN DIAGNOSIS: H&E H&E  CLINICAL HISTORY  SPECIMEN(S) OBTAINED 1. Fallopian tube, left, Segment 2. Fallopian tube, right,  Segment  SPECIMEN COMMENTS: SPECIMEN CLINICAL INFORMATION: 1. Unscheduled cesarean section  Gross Description 1. Received in formalin is a 1.7 cm long, 0.7 cm diameter portion of fallopian tube without fimbria.Representative cross sections in 1 block. 2. Received in formalin is a 1.2 cm long, 0.7 cm diameter portion of fallopian tube without fimbria.Representative cross sections in 1 block.mb 03-09-24    Assessment:   Patient s/p CESAREAN DELIVERY (surgery)  Doing well postoperatively.   Plan:   1. Continue any current medications as instructed by provider. 2. Wound care discussed. 3. Operative findings again reviewed.  4. Activity restrictions: no bending, stooping, or squatting, no lifting more than 15 pounds, and pelvic rest 5. Follow up: 4 weeks PP care 6. Mood stable,     Damien Parsley, CNM [facility]

## 2024-03-18 NOTE — Patient Instructions (Signed)

## 2024-03-23 NOTE — Progress Notes (Unsigned)
   Virtual Visit via Video Note  I connected with Toni Foster on 03/24/24 at   2:35 PM EDT by a video enabled telemedicine application and verified that I am speaking with the correct person using two identifiers.  Location: Patient: Home Provider: OFFICE   I discussed the limitations of evaluation and management by telemedicine and the availability of in person appointments. The patient expressed understanding and agreed to proceed.    History of Present Illness:   Toni Foster is a 33 y.o. G42P2002 female who presents for a 2 week televisit for mood check. She is 2 weeks postpartum following a cesarean delivery.  The delivery was at 40.3 gestational weeks.  Postpartum course has been well so far. Baby is feeding by breast milk by bottle. Bleeding: slowing down. Postpartum depression screening: positive.  EDPS score is 9.      The following portions of the patient's history were reviewed and updated as appropriate: allergies, current medications, past family history, past medical history, past social history, past surgical history, and problem list.   Observations/Objective:   currently breastfeeding. Gen App: NAD Psych: normal speech, affect. Good mood.        03/24/2024    2:30 PM 03/18/2024   11:52 AM 03/09/2024    2:25 PM 10/24/2020   10:00 PM  Edinburgh Postnatal Depression Scale Screening Tool  I have been able to laugh and see the funny side of things. 1 0 0 0  I have looked forward with enjoyment to things. 1 0 0 1  I have blamed myself unnecessarily when things went wrong. 1 1 1  0  I have been anxious or worried for no good reason. 2 2 1 1   I have felt scared or panicky for no good reason. 2 1 1  0  Things have been getting on top of me. 1 1 1 1   I have been so unhappy that I have had difficulty sleeping. 1 1 0 0  I have felt sad or miserable. 0 0 0 1  I have been so unhappy that I have been crying. 0 0 1 1  The thought of harming myself has occurred to me. 0 0 0 0   Edinburgh Postnatal Depression Scale Total 9 6 5 5       Data saved with a previous flowsheet row definition         Assessment and Plan:   1. Encounter for screening for maternal depression - Screening Positive today. Toni Foster is experience new symptoms of anxiety that mainly are about something happening to her children. These thoughts come without trigger and cause her a lot of distress. She is also having nightmares about it. Will add hydroxyzine  PRN. Follow up in two weeks or sooner if needed.    2. Postpartum state - Overall doing well. Continue routine postpartum home care.       Follow Up Instructions:     I discussed the assessment and treatment plan with the patient. The patient was provided an opportunity to ask questions and all were answered. The patient agreed with the plan and demonstrated an understanding of the instructions.   The patient was advised to call back or seek an in-person evaluation if the symptoms worsen or if the condition fails to improve as anticipated.   I provided 15 minutes of non-face-to-face time during this encounter.     Archie Savers, MD  OB/GYN

## 2024-03-24 ENCOUNTER — Telehealth: Admitting: Certified Nurse Midwife

## 2024-03-24 MED ORDER — HYDROXYZINE HCL 25 MG PO TABS: 25.0000 mg | ORAL_TABLET | Freq: Four times a day (QID) | ORAL | 2 refills | Status: AC | PRN

## 2024-04-07 NOTE — Progress Notes (Unsigned)
   Virtual Visit via Video Note  I connected with Toni Foster on 04/08/24 at   1:15 PM EDT by a video enabled telemedicine application and verified that I am speaking with the correct person using two identifiers.  Location: Patient: Home Provider: Office   I discussed the limitations of evaluation and management by telemedicine and the availability of in person appointments. The patient expressed understanding and agreed to proceed.    History of Present Illness:   Toni Foster is a 33 y.o. G50P2002 female who presents for a 2 week televisit for mood check. She is 2 weeks postpartum following a C-Section, Low Transverse delivery.  The delivery was at 40.3 gestational weeks. Baby is feeding by formula and pumped milk. Postpartum depression screening: positive.       The following portions of the patient's history were reviewed and updated as appropriate: allergies, current medications, past family history, past medical history, past social history, past surgical history, and problem list.   Observations/Objective:   currently breastfeeding. Gen App: NAD Psych: normal speech, affect. Good mood.        03/24/2024    2:30 PM 03/18/2024   11:52 AM 03/09/2024    2:25 PM 10/24/2020   10:00 PM  Edinburgh Postnatal Depression Scale Screening Tool  I have been able to laugh and see the funny side of things. 1 0 0 0  I have looked forward with enjoyment to things. 1 0 0 1  I have blamed myself unnecessarily when things went wrong. 1 1 1  0  I have been anxious or worried for no good reason. 2 2 1 1   I have felt scared or panicky for no good reason. 2 1 1  0  Things have been getting on top of me. 1 1 1 1   I have been so unhappy that I have had difficulty sleeping. 1 1 0 0  I have felt sad or miserable. 0 0 0 1  I have been so unhappy that I have been crying. 0 0 1 1  The thought of harming myself has occurred to me. 0 0 0 0  Edinburgh Postnatal Depression Scale Total 9 6 5 5       Data  saved with a previous flowsheet row definition         Assessment and Plan:   1. Encounter for screening for maternal depression - Screening Positive today. Will rescreen at 6 week postpartum visit.  Has felt hydroxyzine  has been helpful when she has used it. Encouraged her to use for sleep more often if she needs.    2. Lactating mother - Has been given formula to try to find balance. Reviewed ways to increase supply.    Follow Up Instructions:     I discussed the assessment and treatment plan with the patient. The patient was provided an opportunity to ask questions and all were answered. The patient agreed with the plan and demonstrated an understanding of the instructions.   The patient was advised to call back or seek an in-person evaluation if the symptoms worsen or if the condition fails to improve as anticipated.   I provided 10 minutes of non-face-to-face time during this encounter.     Damien Parsley, CNM [facility]

## 2024-04-08 ENCOUNTER — Telehealth: Admitting: Certified Nurse Midwife

## 2024-04-23 ENCOUNTER — Ambulatory Visit: Admitting: Certified Nurse Midwife

## 2024-04-23 DIAGNOSIS — Z3009 Encounter for other general counseling and advice on contraception: Secondary | ICD-10-CM

## 2024-04-23 NOTE — Progress Notes (Signed)
   OBSTETRICS POSTPARTUM CLINIC PROGRESS NOTE  Subjective:     Toni Foster is a 33 y.o. G42P2002 female who presents for a postpartum visit. She is 6 week postpartum following a low cervical transverse Cesarean section. I have fully reviewed the prenatal and intrapartum course. The delivery was at 40 gestational weeks.  Anesthesia: epidural. Postpartum course has is getting better since she started on meds. Baby's course has been good. Baby is feeding by bottle - Enfamil with Iron. Bleeding: patient has resumed menses, with Patient's last menstrual period was 05/30/2023.[name] Bowel function is normal. Bladder function is normal. Patient is not sexually active. Contraception method desired is tubal ligation. Postpartum depression screening: positive.  EDPS score is 13.    The following portions of the patient's history were reviewed and updated as appropriate: allergies, current medications, past family history, past medical history, past social history, past surgical history, and problem list.  Review of Systems Pertinent items are noted in HPI.   Objective:    LMP 05/30/2023   General:  alert and no distress   Breasts:  inspection negative, no nipple discharge or bleeding, no masses or nodularity palpable  Lungs: clear to auscultation bilaterally  Heart:  regular rate and rhythm, S1, S2 normal, no murmur, click, rub or gallop  Abdomen: soft, non-tender; bowel sounds normal; no masses,  no organomegaly.  Well healed Pfannenstiel incision   Vulva:  normal  Vagina: normal vagina, no discharge, exudate, lesion, or erythema  Cervix:  no cervical motion tenderness and no lesions  Corpus: normal size, contour, position, consistency, mobility, non-tender  Adnexa:  normal adnexa and no mass, fullness, tenderness  Rectal Exam: Not performed.         Labs:  Lab Results  Component Value Date   HGB 10.8 (L) 03/09/2024     Assessment:   1. Postpartum care and examination   2. Encounter for  other general counseling or advice on contraception      Plan:    1. Contraception: tubal ligation 2. Will check Hgb for h/o postpartum anemia of less than 10.  3. Follow up in: 1 year for annual exam or as needed.   RTC with worsening anxiety symptoms. Will continue atarax  because it is effective. Tried zoloft in the past but did not think that it helped much.     Damien Parsley, CNM [facility]

## 2024-05-27 ENCOUNTER — Telehealth: Payer: Self-pay

## 2024-05-27 NOTE — Telephone Encounter (Signed)
 Pt calling triage wanting to know if she can up her dose of Lexapro . No motivation at all she just lays on the couch until she has to  get up. Please advise if she needs to come in or you can up this. Keynan Heffern cma
